# Patient Record
Sex: Female | Born: 1950 | Race: White | Hispanic: No | Marital: Married | Smoking: Never smoker
Health system: Southern US, Community
[De-identification: ages and names within clinical notes are randomized; demographics above are authoritative.]

## PROBLEM LIST (undated history)

## (undated) DIAGNOSIS — I10 Essential (primary) hypertension: Secondary | ICD-10-CM

## (undated) DIAGNOSIS — Z950 Presence of cardiac pacemaker: Secondary | ICD-10-CM

---

## 2004-05-20 DIAGNOSIS — S83511A Sprain of anterior cruciate ligament of right knee, initial encounter: Secondary | ICD-10-CM

## 2004-05-20 HISTORY — DX: Sprain of anterior cruciate ligament of right knee, initial encounter: S83.511A

## 2019-11-01 DIAGNOSIS — I10 Essential (primary) hypertension: Secondary | ICD-10-CM | POA: Insufficient documentation

## 2020-05-20 DIAGNOSIS — Z950 Presence of cardiac pacemaker: Secondary | ICD-10-CM

## 2020-05-20 HISTORY — DX: Presence of cardiac pacemaker: Z95.0

## 2021-05-20 DIAGNOSIS — R001 Bradycardia, unspecified: Secondary | ICD-10-CM

## 2021-05-20 HISTORY — DX: Bradycardia, unspecified: R00.1

## 2021-06-05 ENCOUNTER — Emergency Department (HOSPITAL_COMMUNITY): Payer: Managed Care, Other (non HMO)

## 2021-06-05 ENCOUNTER — Other Ambulatory Visit: Payer: Self-pay

## 2021-06-05 ENCOUNTER — Ambulatory Visit (HOSPITAL_COMMUNITY)
Admission: RE | Admit: 2021-06-05 | Discharge: 2021-06-05 | Disposition: A | Discharge status: Other Acute Inpatient Hospital | Payer: Managed Care, Other (non HMO) | Source: Ambulatory Visit

## 2021-06-05 ENCOUNTER — Encounter (HOSPITAL_COMMUNITY): Payer: Self-pay | Admitting: Emergency Medicine

## 2021-06-05 ENCOUNTER — Emergency Department (HOSPITAL_COMMUNITY)
Admission: EM | Admit: 2021-06-05 | Discharge: 2021-06-05 | Disposition: A | Discharge status: Home / Self Care | Payer: Managed Care, Other (non HMO) | Attending: Emergency Medicine | Admitting: Emergency Medicine

## 2021-06-05 VITALS — BP 156/112 | HR 60 | Temp 98.6°F | Resp 20

## 2021-06-05 DIAGNOSIS — T82198A Other mechanical complication of other cardiac electronic device, initial encounter: Secondary | ICD-10-CM | POA: Insufficient documentation

## 2021-06-05 DIAGNOSIS — Y69 Unspecified misadventure during surgical and medical care: Secondary | ICD-10-CM | POA: Insufficient documentation

## 2021-06-05 DIAGNOSIS — T82111A Breakdown (mechanical) of cardiac pulse generator (battery), initial encounter: Secondary | ICD-10-CM

## 2021-06-05 DIAGNOSIS — T829XXA Unspecified complication of cardiac and vascular prosthetic device, implant and graft, initial encounter: Secondary | ICD-10-CM

## 2021-06-05 DIAGNOSIS — I10 Essential (primary) hypertension: Secondary | ICD-10-CM | POA: Diagnosis not present

## 2021-06-05 HISTORY — DX: Essential (primary) hypertension: I10

## 2021-06-05 HISTORY — DX: Presence of cardiac pacemaker: Z95.0

## 2021-06-05 LAB — BASIC METABOLIC PANEL
Anion gap: 10 (ref 5–15)
BUN: 25 mg/dL — ABNORMAL HIGH (ref 8–23)
CO2: 24 mmol/L (ref 22–32)
Calcium: 9.2 mg/dL (ref 8.9–10.3)
Chloride: 102 mmol/L (ref 98–111)
Creatinine, Ser: 0.89 mg/dL (ref 0.44–1.00)
GFR, Estimated: >60 mL/min (ref >60)
Glucose, Bld: 96 mg/dL (ref 70–99)
Potassium: 3.9 mmol/L (ref 3.5–5.1)
Sodium: 136 mmol/L (ref 135–145)

## 2021-06-05 LAB — CBC
HCT: 35.5 % — ABNORMAL LOW (ref 36.0–46.0)
Hemoglobin: 11.8 g/dL — ABNORMAL LOW (ref 12.0–15.0)
MCH: 30.6 pg (ref 26.0–34.0)
MCHC: 33.2 g/dL (ref 30.0–36.0)
MCV: 92.2 fL (ref 80.0–100.0)
Platelets: 303 10*3/uL (ref 150–400)
RBC: 3.85 MIL/uL — ABNORMAL LOW (ref 3.87–5.11)
RDW: 13 % (ref 11.5–15.5)
WBC: 6.3 10*3/uL (ref 4.0–10.5)
nRBC: 0 % (ref 0.0–0.2)

## 2021-06-05 LAB — TROPONIN I (HIGH SENSITIVITY)
Troponin I (High Sensitivity): 3 ng/L (ref <18)
Troponin I (High Sensitivity): 5 ng/L (ref <18)

## 2021-06-05 NOTE — ED Triage Notes (Signed)
Patient here for evaluation of pacemaker problem. Patient had Medtronic pacemaker implanted for bradycardia while in [place] on March 02, 2021. Then four weeks while visiting [place] and [employer] patient states she walked through several metal detectors and started feeling a random shocking sensation around the location of the pacemaker. Patient also states the pacemaker location has moved since it was first implanted. Patient alert, oriented, and in no apparent distress at this time.

## 2021-06-05 NOTE — ED Triage Notes (Signed)
One week ago pt feels pace maker when it kicks in Per pt.

## 2021-06-05 NOTE — ED Provider Notes (Signed)
[facility] EMERGENCY DEPARTMENT Provider Note   CSN: [id number] Arrival date & time: 06/05/21  1300     History  Chief Complaint  Patient presents with   Pacemaker Problem    Mallory Adams is a 71 y.o. female.  With past medical history of hypertension, bradycardia requiring pacemaker placement who presents to the emergency department with complaints that her pacemaker is firing.  Patient states that she has had a sensation about twice a day that her pacemaker is "zapping her."  She states that she feels that multiple times a day and has soreness over the area where her pacemaker was inserted.  She states that it during any of these episodes she is not having chest pain, shortness of breath, lightheadedness or dizziness.  She has never had syncopal episode.  She states that her pacemaker is not also a defibrillator.   Patient had pacemaker placed in October 2022 in [place].  She states that while she was overseas she was having episodes of syncope as well as feeling very weak.  She was found to be bradycardic requiring pacemaker placement.  It appears that she has a single noted atrial pacemaker without cardioverter defibrillator.  She states that she then returned to the states and in December was at [facility].  She states that she had walked through multiple metal detectors and feels like she was zapped going through these as well.  She is unsure if the metal detectors messed up her pacemaker.  Additionally she feels like the pacemaker has migrated about a centimeter medially from where it was inserted.  She has not been able to follow-up with cardiology since returning and states that there was a 2 to 28-month wait to be seen by cardiologist as a new patient.  HPI     Home Medications Prior to Admission medications   Not on File      Allergies    Ciprofloxacin    Review of Systems   Review of Systems  Constitutional:  Negative for diaphoresis and  fever.  Respiratory:  Negative for shortness of breath.   Cardiovascular:  Positive for chest pain. Negative for palpitations and leg swelling.  Gastrointestinal:  Negative for nausea.  Neurological:  Negative for dizziness, syncope and light-headedness.   Physical Exam Updated Vital Signs BP (!) 147/67    Pulse (!) 59    Temp 98.7 F (37.1 C) (Oral)    Resp 17    SpO2 100%  Physical Exam Vitals and nursing note reviewed.  Constitutional:      General: She is not in acute distress.    Appearance: Normal appearance. She is not ill-appearing or toxic-appearing.  HENT:     Head: Normocephalic and atraumatic.     Mouth/Throat:     Mouth: Mucous membranes are moist.     Pharynx: Oropharynx is clear.  Eyes:     General: No scleral icterus.    Extraocular Movements: Extraocular movements intact.     Pupils: Pupils are equal, round, and reactive to light.  Cardiovascular:     Rate and Rhythm: Normal rate and regular rhythm.     Pulses:          Radial pulses are 2+ on the right side and 2+ on the left side.       Dorsalis pedis pulses are 1+ on the right side and 1+ on the left side.     Heart sounds: No murmur heard. Pulmonary:     Effort:  Pulmonary effort is normal. No respiratory distress.     Breath sounds: Normal breath sounds.  Chest:     Comments: 2 cm well-healing scar in the left chest wall from pacemaker placement.  The pacemaker is palpable under the scarring.  There is no erythema, cellulitis, drainage or warmth over the area. Abdominal:     General: Bowel sounds are normal. There is no distension.     Palpations: Abdomen is soft.  Musculoskeletal:        General: Normal range of motion.     Cervical back: Normal range of motion and neck supple.     Right lower leg: No edema.     Left lower leg: No edema.  Skin:    General: Skin is warm and dry.     Capillary Refill: Capillary refill takes less than 2 seconds.     Findings: No erythema.  Neurological:     General:  No focal deficit present.     Mental Status: She is alert and oriented to person, place, and time. Mental status is at baseline.  Psychiatric:        Mood and Affect: Mood normal.        Behavior: Behavior normal.        Thought Content: Thought content normal.        Judgment: Judgment normal.    ED Results / Procedures / Treatments   Labs (all labs ordered are listed, but only abnormal results are displayed) Labs Reviewed  BASIC METABOLIC PANEL - Abnormal; Notable for the following components:      Result Value   BUN 25 (*)    All other components within normal limits  CBC - Abnormal; Notable for the following components:   RBC 3.85 (*)    Hemoglobin 11.8 (*)    HCT 35.5 (*)    All other components within normal limits  TROPONIN I (HIGH SENSITIVITY)  TROPONIN I (HIGH SENSITIVITY)    EKG EKG Interpretation  Date/Time:  Tuesday June 05 2021 13:07:05 EST Ventricular Rate:  60 PR Interval:  124 QRS Duration: 90 QT Interval:  400 QTC Calculation: 400 R Axis:   80 Text Interpretation: Atrial-paced rhythm No previous tracing Confirmed by Cathren LaineSteinl, Kevin ([id number]) on 06/05/2021 8:40:59 PM  Radiology DG Chest 2 View  Result Date: 06/05/2021 CLINICAL DATA:  Palpitations EXAM: CHEST - 2 VIEW COMPARISON:  None. FINDINGS: Heart size is normal. Mediastinum appears normal. Left-sided cardiac pacemaker device. Lungs are mildly hyperinflated. No focal consolidation, pleural effusion or pneumothorax identified. IMPRESSION: No acute intrathoracic process identified. Electronically Signed   By: Jannifer Hickelaney  Williams M.D.   On: 06/05/2021 14:19    Procedures Procedures   Medications Ordered in ED Medications - No data to display  ED Course/ Medical Decision Making/ A&P                           Medical Decision Making Patient presents to the ED with complaints of her firing. This involves an extensive number of treatment options, and is a complaint that carries with it a high risk of  complications and morbidity.   Additional history obtained:  Additional history obtained from: Husband External records from outside source obtained and reviewed including: Previous primary care visits  EKG: Atrial paced at rate of 60.  No ectopy  Cardiac Monitoring: The patient was maintained on a cardiac monitor.  I personally viewed and interpreted the cardiac monitored which showed an underlying  rhythm of: Paced rhythm  Lab Results: I Ordered, reviewed, and interpreted labs. Pertinent results include: BMP unremarkable CBC with hemoglobin of 11.8, no previous to compare.  She is not tachycardic, hypotensive, pale or symptomatic. Troponin x2 negative  Imaging Studies ordered:  I ordered imaging studies which included chest x-ray.  I independently reviewed & interpreted imaging & am in agreement with radiology impression. Imaging shows: No acute intrathoracic processes identified  Critical Interventions: Pacemaker interrogated by Medtronic  Consultations: 1823: Spoke with Dr. Shari Prows, cardiology, who has reviewed Medtronic interrogation.  She has also spoken with [employer] representative who states that pacemaker programming is in unipolar rather than bipolar mode.  She has dispatched somebody to present to the emergency department and reprogram pacemaker.  Feels she can likely be discharged after this.  ED Course: 71 year old female who presents emergency department for the feeling that her pacemaker is firing.  She is otherwise asymptomatic.  Her lab work is unremarkable here.  EKG without signs of ischemia or infarction and troponin x2 negative.  She does not have chest pain or shortness of breath. Her pacemaker was interrogated by Medtronic.  Dr. Shari Prows with cardiology reviewed the Medtronic interrogation.  She states that there is a [employer] representative who is presenting to the emergency department to evaluate the pacemaker and ensure that it is properly  functioning.  Problem List:  Pacemaker problem Patient hemodynamically stable, nontoxic in appearance otherwise asymptomatic except for feeling like her pacemaker is firing EKG without ischemia or infarction.  Atrially paced.  Troponin x2 negative.  Doubt ACS. Symptoms are consistent with PE, pneumothorax, dissection, pericarditis, tamponade, pneumonia, myocarditis Medtronic interrogated pacemaker At time of handoff to attending, Dr. Denton Lank patient is pending Med[employer] representative presenting to the emergency department to evaluate her pacemaker present.  Per Dr. Julianne Handler and she may need to change from unipolar to bipolar mode however [employer] will evaluate this.  She thinks that patient is likely stable for discharge after it is evaluated.  Dispostion:  After consideration of the diagnostic results and the patients response to treatment, I feel that the patent would benefit from likely discharge.  [employer] is currently presenting to the emergency department to evaluate the patient's pacemaker and discharge will be pending this.  Patient care handed off to Dr. Denton Lank prior to patient final disposition.   Final Clinical Impression(s) / ED Diagnoses Final diagnoses:  Disorder of cardiac pacemaker system, initial encounter    Rx / DC Orders ED Discharge Orders          Ordered    Ambulatory referral to Cardiology       Comments: Recent pacemaker insertion in [place] for bradycardia, no cardiologist in area for follow-up. Seen in ED for abnormal firing. Found to be programmed in unipolar, not bipolar. Fixed by Medtronic in ED.   06/05/21 1907              Cristopher Peru, PA-C 06/07/21 2229    Bethann Berkshire, MD 06/07/21 1148

## 2021-06-05 NOTE — ED Notes (Signed)
Pt provided with warm blankets. Lights dimmed and head of bed lowered per pt request. Denies further needs. Call light within reach.

## 2021-06-05 NOTE — ED Notes (Signed)
TC to triage in ED to give report on Pt with pace maker problem.

## 2021-06-05 NOTE — ED Provider Triage Note (Signed)
Emergency Medicine Provider Triage Evaluation Note  Mallory Adams , a 71 y.o. female  was evaluated in triage.  Pt complains of a pacemaker problem.  Patient states that she had a Medtronic pacemaker implanted for bradycardia while in [place] on 03/02/21.  She states 4 weeks later she was visiting [address], and states that while she was walking through [employer] she began feeling random shocking sensation around the location of her pacemaker.  She has continued to feel these symptoms for the past week since she is gotten back.  She also states that she thinks the pacemaker location has moved since it was first implanted.  Review of Systems  Positive: Palpitations, shortness of breath Negative: Fever, chills, cough, chest pain  Physical Exam  BP (!) 148/72 (BP Location: Right Arm)    Pulse (!) 59    Temp 98.7 F (37.1 C) (Oral)    Resp 14    SpO2 100%  Gen:   Awake, no distress   Resp:  Normal effort  MSK:   Moves extremities without difficulty  Other:    Medical Decision Making  Medically screening exam initiated at 1:22 PM.  Appropriate orders placed.  Mallory Adams was informed that the remainder of the evaluation will be completed by another provider, this initial triage assessment does not replace that evaluation, and the importance of remaining in the ED until their evaluation is complete.     Mallory Encarnacion T, PA-C 06/05/21 1327

## 2021-06-05 NOTE — Discharge Instructions (Addendum)
It was our pleasure to provide your ER care today - we hope that you feel better.  The medtronic/pacemaker tech indicates it currently appears your device is working normally.   Follow up closely with cardiologist in the next 1-2 weeks - call office tomorrow AM to arrange appointment.   Return to ER if worse, new symptoms, fevers, persistent fast heart beat, fainting, chest pain, trouble breathing, or other concern.

## 2021-06-05 NOTE — Progress Notes (Signed)
Contacted by medtronic rep, pacemaker with normal function. Could not reproduce her symptoms with high output pacing and symptoms did not resolve with inhibition of pacing. Symptoms are not related to pacemaker, ok for discharge from cardiac standpoint.   Carlyle Dolly MD

## 2021-06-05 NOTE — ED Provider Notes (Signed)
Medtronic tech evaluated pacemaker and indicates it is functioning normally, and device function/activity does not appear to be related to patients symptomatology. He discussed w cardiology.  Pt alert, content, no distres. Vitals stable.  Patient currently appears stable for d/c.   Rec outpatient f/u pcp/cardiology.  Return precautions provided.      Lajean Saver, MD 06/05/21 2030

## 2021-06-05 NOTE — ED Provider Notes (Signed)
[facility]    CSN: [id number] Arrival date & time: 06/05/21  1203      History   Chief Complaint Chief Complaint  Patient presents with   pace maker problem    HPI Mallory Adams is a 71 y.o. female.   Patient presents for evaluation of pacemaker which has been misfiring since mid December after visit to [place].  Patient endorses that unknown to her knowledge there were metal detectors placed in the lines of the rides.  Symptoms began after visit.  Can feel pacemaker shocking her and her skin at incision site turns red.  Pacemaker was placed in [place] in mid October 2022 due to bradycardia and syncope which patient endorses runs in her family.  Denies chest pain or tightness, shortness of breath, palpitations, dizziness, lightheadedness.  No past medical history on file.  There are no problems to display for this patient.    OB History   No obstetric history on file.      Home Medications    Prior to Admission medications   Not on File    Family History No family history on file.  Social History     Allergies   Patient has no allergy information on record.   Review of Systems Review of Systems   Physical Exam Triage Vital Signs ED Triage Vitals  Enc Vitals Group     BP 06/05/21 1232 (!) 156/112     Pulse Rate 06/05/21 1232 60     Resp 06/05/21 1232 20     Temp 06/05/21 1232 98.6 F (37 C)     Temp src --      SpO2 --      Weight --      Height --      Head Circumference --      Peak Flow --      Pain Score 06/05/21 1233 0     Pain Loc --      Pain Edu? --      Excl. in GC? --    No data found.  Updated Vital Signs BP (!) 156/112    Pulse 60    Temp 98.6 F (37 C)    Resp 20   Visual Acuity Right Eye Distance:   Left Eye Distance:   Bilateral Distance:    Right Eye Near:   Left Eye Near:    Bilateral Near:     Physical Exam   UC Treatments / Results  Labs (all labs ordered are listed, but only  abnormal results are displayed) Labs Reviewed - No data to display  EKG   Radiology No results found.  Procedures Procedures (including critical care time)  Medications Ordered in UC Medications - No data to display  Initial Impression / Assessment and Plan / UC Course  I have reviewed the triage vital signs and the nursing notes.  Pertinent labs & imaging results that were available during my care of the patient were reviewed by me and considered in my medical decision making (see chart for details).  Malfunction of cardiac pacemaker, initial encounter  Patient sent to the nearest emergency department for evaluation of missed firing of pacemaker.  Vital signs are stable, will be escorted by family member. Final Clinical Impressions(s) / UC Diagnoses   Final diagnoses:  None   Discharge Instructions   None    ED Prescriptions   None    PDMP not reviewed this encounter.   Valinda Hoar, NP 06/05/21  1301 ° °

## 2021-06-05 NOTE — ED Notes (Signed)
Pacemaker interrogated at this time.

## 2021-06-25 ENCOUNTER — Other Ambulatory Visit: Payer: Self-pay

## 2021-06-25 ENCOUNTER — Ambulatory Visit (INDEPENDENT_AMBULATORY_CARE_PROVIDER_SITE_OTHER): Payer: Self-pay | Admitting: Family

## 2021-06-25 ENCOUNTER — Encounter: Payer: Self-pay | Admitting: Family

## 2021-06-25 VITALS — BP 144/73 | HR 60 | Temp 97.3°F | Ht 64.5 in | Wt 141.8 lb

## 2021-06-25 DIAGNOSIS — Z950 Presence of cardiac pacemaker: Secondary | ICD-10-CM

## 2021-06-25 DIAGNOSIS — Z1231 Encounter for screening mammogram for malignant neoplasm of breast: Secondary | ICD-10-CM

## 2021-06-25 DIAGNOSIS — M816 Localized osteoporosis [Lequesne]: Secondary | ICD-10-CM

## 2021-06-25 DIAGNOSIS — I10 Essential (primary) hypertension: Secondary | ICD-10-CM

## 2021-06-25 NOTE — Patient Instructions (Signed)
Welcome to [facility]! It was a pleasure meeting you today.  As discussed, I have ordered a bone density scan and mammogram today, the [facility] will call you directly to schedule. Please let Cardiology know that I am your PCP now so that I will receive a copy of their notes.  Please schedule a 6 month follow up visit today if you are needing refills.    PLEASE NOTE:  If you had any LAB tests please let us know if you have not heard back within a few days. You may see your results on MyChart before we have a chance to review them but we will give you a call once they are reviewed by [place]. If we ordered any REFERRALS today, please let us know if you have not heard from their office within the next week.  Let us know through MyChart if you are needing REFILLS, or have your pharmacy send [place] the request. You can also use MyChart to communicate with me or any office staff.  Please try these tips to maintain a healthy lifestyle:  Eat most of your calories during the day when you are active. Eliminate processed foods including packaged sweets (pies, cakes, cookies), reduce intake of potatoes, white bread, white pasta, and white rice. Look for whole grain options, oat flour or almond flour.  Each meal should contain half fruits/vegetables, one quarter protein, and one quarter carbs (no bigger than a computer mouse).  Cut down on sweet beverages. This includes juice, soda, and sweet tea. Also watch fruit intake, though this is a healthier sweet option, it still contains natural sugar! Limit to 3 servings daily.  Drink at least 1 glass of water with each meal and aim for at least 8 glasses per day  Exercise at least 150 minutes every week.

## 2021-06-25 NOTE — Progress Notes (Signed)
New Patient Office Visit  Subjective:  Patient ID: Mallory Adams, female    DOB: 05-Aug-1950  Age: 71 y.o. MRN: [id number]  CC:  Chief Complaint  Patient presents with   Establish Care   Pacemaker Problem    Shocks; Pt feels like these are Post Covid symtpoms.   Hypertension    HPI Mallory Adams presents for establishing care and to discuss 2 problems.  Hypertension: Patient is currently maintained on the following medications for blood pressure: Amlodipine. Failed meds include: None Patient reports good compliance with blood pressure medications. Patient denies chest pain, headaches, shortness of breath or swelling. Last 3 blood pressure readings in our office are as follows: BP Readings from Last 3 Encounters:  06/25/21 (!) 144/73  06/05/21 (!) 161/64  06/05/21 (!) 156/112   Pacemaker:  pt reports sx in 02/2021 while living in [place] with her husband with having syncopal episode, feeling tired, evaluated and found to have bradycardia, Pacemaker then inserted. Reports she still felt tired afterwards, and she was pacing at 50, increased to 60 and she felt better. Since visiting [place] in Dec she has been feeling "zaps" from her pacemaker that cause soreness and her insertion scar to redden. She was evaluated a few weeks ago in the ER and pacemaker found to be operating properly, but may need adjusting, pt states she has a cardiology appt scheduled.   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 2022    History reviewed. No pertinent surgical history.  Family History  Problem Relation Age of Onset   Depression Mother    Mental illness Mother    Heart disease Father    Heart attack Father    Stroke Sister    Early death Brother    Drug abuse  Brother    Alcohol abuse Brother     Social History   Socioeconomic History   Marital status: Married    Spouse name: Dub Mikes   Number of children: Not on file   Years of education: Not on file   Highest education level: Not on file  Occupational History   Not on file  Tobacco Use   Smoking status: Never   Smokeless tobacco: Never  Substance and Sexual Activity   Alcohol use: Yes    Alcohol/week: 2.0 standard drinks    Types: 2 Glasses of wine per week   Drug use: Never   Sexual activity: Yes    Partners: Male  Other Topics Concern   Not on file  Social History Narrative   pt is a retired Electrical engineer   Social Determinants of Radio broadcast assistant Strain: Not on file  Food Insecurity: Not on file  Transportation Needs: Not on file  Physical Activity: Not on file  Stress: Not on file  Social Connections: Not on file  Intimate Partner Violence: Not on file    Objective:   Today's Vitals: BP (!) 144/73    Pulse 60    Temp (!) 97.3 F (36.3 C) (Temporal)    Ht 5' 4.5" (1.638 m)    Wt 141 lb 12.8 oz (64.3 kg)    SpO2 98%    BMI 23.96 kg/m   Physical Exam Vitals and nursing note reviewed.  Constitutional:      Appearance: Normal appearance.  Cardiovascular:     Rate and Rhythm: Normal rate and regular rhythm.  Pulmonary:     Effort: Pulmonary effort is normal.     Breath sounds: Normal breath sounds.  Musculoskeletal:        General: Normal range of motion.  Skin:    General: Skin is warm and dry.     Comments: subdermal pacemaker noted and scar with mild erythema on left upper chest  Neurological:     Mental Status: She is alert.  Psychiatric:        Mood and Affect: Mood normal.        Behavior: Behavior normal.    Assessment & Plan:   Problem List Items Addressed This Visit       Cardiovascular and Mediastinum   Essential hypertension    Chronic - elevated today with Amlodipine, pt would like to wait until she sees Cardiology  (pacemaker problems) before getting a refill, has enough pills for now. Advised to f/u in 6 mos if refill needed.      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 denies any hx of smoking or fracture.      Relevant Medications   Cholecalciferol 25 MCG (1000 UT) capsule   Other Relevant Orders   DG Bone Density     Other   Cardiac pacemaker in situ - Primary    placed 02/2021 in [place]. set at 60bpm, but reports her skin is more flushed in color and feeling "zaps" several times per day, evaluated in ER a few weeks, all testing wnl, may need a bipolar vs. unipolar pacer,  pt has already scheduled a Cardiology appt.      Other Visit Diagnoses     Encounter for screening mammogram for breast cancer       Relevant Orders   MM DIGITAL SCREENING BILATERAL       Outpatient Encounter Medications as of 06/25/2021  Medication Sig   amLODipine (NORVASC) 5 MG tablet Take by mouth.   BLACK PEPPER-TURMERIC PO Take by mouth.   Cholecalciferol 25 MCG (1000 UT) capsule Take 1 capsule by mouth daily.   Magnesium 250 MG TABS Take 1 tablet by mouth daily.   Multiple Vitamins-Minerals (MULTIVITAMIN WOMEN 50+) TABS Take 1 tablet by mouth daily.   Omega-3 Fatty Acids (FISH OIL) 1000 MG CAPS Take by mouth.   No facility-administered encounter medications on file as of 06/25/2021.    Follow-up: Return in about 6 months (around 12/23/2021) for if medication refill needed or sooner for any concerns.   Jeanie Sewer, NP

## 2021-06-25 NOTE — Assessment & Plan Note (Addendum)
placed 02/2021 in [place]. set at 60bpm, but reports her skin is more flushed in color and feeling "zaps" several times per day, evaluated in ER a few weeks, all testing wnl, may need a bipolar vs. unipolar pacer,  pt has already scheduled a Cardiology appt.

## 2021-06-25 NOTE — Assessment & Plan Note (Addendum)
Chronic - elevated today with Amlodipine, pt would like to wait until she sees Cardiology (pacemaker problems) before getting a refill, has enough pills for now. Advised to f/u in 6 mos if refill needed.

## 2021-06-25 NOTE — Assessment & Plan Note (Addendum)
pt told this is in her right hip, DEXA scan done in [place], do not have records, repeating scan. denies any hx of smoking or fracture.

## 2021-07-02 ENCOUNTER — Other Ambulatory Visit: Payer: Self-pay | Admitting: Family

## 2021-07-02 ENCOUNTER — Other Ambulatory Visit: Payer: Self-pay

## 2021-07-02 MED ORDER — AMLODIPINE BESYLATE 5 MG PO TABS
5.0000 mg | ORAL_TABLET | Freq: Every day | ORAL | 0 refills | Status: DC
  Filled 2021-07-02 – 2021-07-04 (×2): qty 30, 30d supply, fill #0

## 2021-07-02 NOTE — Progress Notes (Deleted)
Electrophysiology Office Note:    Date:  07/02/2021   ID:  Mallory Adams, DOB [birth year], MRN [id number]  PCP:  Dulce Sellar, NP  [facility] Cardiologist:  None  [facility] Electrophysiologist:  None   Referring MD: Cristopher Peru, PA-C   Chief Complaint: Permanent pacemaker  History of Present Illness:    Mallory Adams is a 71 y.o. female who presents for an evaluation of permanent pacemaker at the request of [name], PA-C. Their medical history includes symptomatic bradycardia post permanent pacemaker implant in [place].  She presents to establish care in our clinic.  The pacemaker was implanted in October 2022 in [place].  While traveling there she was having episodes of syncope.  She recently presented to the emergency department on June 05, 2021 reporting a "zapping" sensation from her pacemaker.  The device was interrogated and found to be functioning appropriately.  According to notes, the device was found to be in a unipolar pacing configuration by the [employer] representative.  The device was reprogrammed to bipolar in an effort to minimize her probable pectoral stim.     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 2022    No past surgical history on file.  Current Medications: No outpatient medications have been marked as taking for the 07/03/21 encounter (Appointment) with Lanier Prude, MD.     Allergies:   Ciprofloxacin, Gluten meal, and Influenza virus vaccine   Social History   Socioeconomic History   Marital status: Married    Spouse name: [name]   Number of children: Not on file   Years of education: Not on file   Highest education level: Not on file  Occupational History   Not on file  Tobacco Use    Smoking status: Never   Smokeless tobacco: Never  Substance and Sexual Activity   Alcohol use: Yes    Alcohol/week: 2.0 standard drinks    Types: 2 Glasses of wine per week   Drug use: Never   Sexual activity: Yes    Partners: Male  Other Topics Concern   Not on file  Social History Narrative   pt is a retired Doctor, general practice   Social Determinants of Corporate investment banker Strain: Not on file  Food Insecurity: Not on file  Transportation Needs: Not on file  Physical Activity: Not on file  Stress: Not on file  Social Connections: Not on file     Family History: The patient's family history includes Alcohol abuse in her brother; Depression in her mother; Drug abuse in her brother; Early death in her brother; Heart attack in her father; Heart disease in her father; Mental illness in her mother; Stroke in her sister.  ROS:   Please see the history of present illness.    All other systems reviewed and are negative.  EKGs/Labs/Other Studies Reviewed:    The following studies were reviewed today:  June 06, 2021 EKG shows atrial pacing, ventricular sensing  Chest x-ray from June 05, 2021 shows a dual-chamber permanent pacemaker in situ originating from the left chest.  EKG:  The ekg ordered today demonstrates ***   Recent Labs: 06/05/2021: BUN 25; Creatinine, Ser 0.89; Hemoglobin 11.8; Platelets 303; Potassium 3.9; Sodium 136  Recent  Lipid Panel No results found for: CHOL, TRIG, HDL, CHOLHDL, VLDL, LDLCALC, LDLDIRECT  Physical Exam:    VS:  There were no vitals taken for this visit.    Wt Readings from Last 3 Encounters:  06/25/21 141 lb 12.8 oz (64.3 kg)     GEN: *** Well nourished, well developed in no acute distress HEENT: Normal NECK: No JVD; No carotid bruits LYMPHATICS: No lymphadenopathy CARDIAC: ***RRR, no murmurs, rubs, gallops RESPIRATORY:  Clear to auscultation without rales, wheezing or rhonchi  ABDOMEN: Soft, non-tender,  non-distended MUSCULOSKELETAL:  No edema; No deformity  SKIN: Warm and dry NEUROLOGIC:  Alert and oriented x 3 PSYCHIATRIC:  Normal affect       ASSESSMENT:    1. Cardiac pacemaker in situ   2. Symptomatic bradycardia    PLAN:    In order of problems listed above:  Establish in pacemaker clinic       Total time spent with patient today *** minutes. This includes reviewing records, evaluating the patient and coordinating care.  Medication Adjustments/Labs and Tests Ordered: Current medicines are reviewed at length with the patient today.  Concerns regarding medicines are outlined above.  No orders of the defined types were placed in this encounter.  No orders of the defined types were placed in this encounter.    Signed, Rossie Muskrat. Lalla Brothers, MD, [facility], [facility] 07/02/2021 12:34 PM    Electrophysiology [facility]

## 2021-07-03 ENCOUNTER — Other Ambulatory Visit: Payer: Self-pay

## 2021-07-03 ENCOUNTER — Ambulatory Visit (INDEPENDENT_AMBULATORY_CARE_PROVIDER_SITE_OTHER): Payer: Self-pay | Admitting: Cardiology

## 2021-07-03 ENCOUNTER — Encounter: Payer: Self-pay | Admitting: Cardiology

## 2021-07-03 VITALS — BP 120/68 | HR 60 | Ht 64.5 in | Wt 142.2 lb

## 2021-07-03 DIAGNOSIS — R001 Bradycardia, unspecified: Secondary | ICD-10-CM

## 2021-07-03 DIAGNOSIS — Z950 Presence of cardiac pacemaker: Secondary | ICD-10-CM

## 2021-07-03 NOTE — Progress Notes (Addendum)
Electrophysiology Office Note:    Date:  07/03/2021   ID:  Mallory Adams, DOB [birth year], MRN [id number]  PCP:  Dulce Sellar, NP  [facility] Cardiologist:  None  [facility] Electrophysiologist:  None   Referring MD: Cristopher Peru, PA-C   Chief Complaint: Permanent pacemaker  History of Present Illness:    Mallory Adams is a 71 y.o. female who presents for an evaluation of permanent pacemaker at the request of Mallory Baars, PA-C. Their medical history includes symptomatic bradycardia post permanent pacemaker implant in [place].  She presents to establish care in our clinic.  The pacemaker was implanted in October 2022 in [place].  While traveling there she was having episodes of syncope.  She recently presented to the emergency department on June 05, 2021 reporting a "zapping" sensation from her pacemaker.  The device was interrogated and found to be functioning appropriately.  According to notes, the device was found to be in a unipolar pacing configuration by the [employer] representative.  The device was reprogrammed to bipolar in an effort to minimize her probable pectoral stim.  Overall, she appears well. Initially she believed she was having anxiety attacks while she was in [place]. She describes feeling like she has having "a sudden, little dream" prior to becoming syncopal.  Since her device was reprogrammed to bipolar, she notes her incision is still red, and it is perpetually painful. This concerns her as she believed her initial incision seemed to heal more quickly.  She denies any palpitations, chest pain, shortness of breath, or peripheral edema. No lightheadedness, headaches, orthopnea, or PND.      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 2022    History reviewed. No pertinent surgical history.  Current Medications: Current Meds  Medication Sig   amLODipine (NORVASC) 5 MG tablet Take 1 tablet (5 mg total) by mouth daily.   BLACK PEPPER-TURMERIC PO Take by mouth.   Cholecalciferol 25 MCG (1000 UT) capsule Take 1 capsule by mouth daily.   Magnesium 250 MG TABS Take 1 tablet by mouth daily.   Multiple Vitamins-Minerals (MULTIVITAMIN WOMEN 50+) TABS Take 1 tablet by mouth daily.   Omega-3 Fatty Acids (FISH OIL) 1000 MG CAPS Take by mouth.     Allergies:   Ciprofloxacin, Gluten meal, and Influenza virus vaccine   Social History   Socioeconomic History   Marital status: Married    Spouse name: Mallory Adams   Number of children: Not on file   Years of education: Not on file   Highest education level: Not on file  Occupational History   Not on file  Tobacco Use   Smoking status: Never   Smokeless tobacco: Never  Vaping Use   Vaping Use: Not on file  Substance and Sexual Activity   Alcohol use: Yes    Alcohol/week: 2.0 standard drinks    Types: 2 Glasses of wine per week   Drug use: Never   Sexual activity: Yes    Partners: Male  Other Topics Concern   Not on file  Social History Narrative   pt is a retired Doctor, general practice   Social Determinants of Corporate investment banker Strain: Not on file  Food Insecurity: Not on file  Transportation Needs:  Not on file  Physical Activity: Not on file  Stress: Not on file  Social Connections: Not on file     Family History: The patient's family history includes Alcohol abuse in her brother; Depression in her mother; Drug abuse in her brother; Early death in her brother; Heart attack in her father; Heart disease in her father; Mental illness in her mother; Stroke in her sister.  ROS:   Please see the history of present illness.    (+) Left chest pain at site of incision All other systems reviewed and are negative.  EKGs/Labs/Other Studies Reviewed:     The following studies were reviewed today:  June 06, 2021 EKG shows atrial pacing, ventricular sensing  Chest x-ray from June 05, 2021 shows a dual-chamber permanent pacemaker in situ originating from the left chest.  July 03, 2021 in clinic device interrogation personally reviewed Lead parameters stable.  Adjusted lead outputs to maximize battery longevity.  EKG:  EKG is personally reviewed. 07/03/2021: Atrial pacing, ventricular sensing   Recent Labs: 06/05/2021: BUN 25; Creatinine, Ser 0.89; Hemoglobin 11.8; Platelets 303; Potassium 3.9; Sodium 136   Recent Lipid Panel No results found for: CHOL, TRIG, HDL, CHOLHDL, VLDL, LDLCALC, LDLDIRECT  Physical Exam:    VS:  BP 120/68    Pulse 60    Ht 5' 4.5" (1.638 m)    Wt 142 lb 3.2 oz (64.5 kg)    SpO2 98%    BMI 24.03 kg/m     Wt Readings from Last 3 Encounters:  07/03/21 142 lb 3.2 oz (64.5 kg)  06/25/21 141 lb 12.8 oz (64.3 kg)     GEN: Well nourished, well developed in no acute distress HEENT: Normal NECK: No JVD; No carotid bruits LYMPHATICS: No lymphadenopathy CARDIAC: RRR, no murmurs, rubs, gallops.  Well-healed pacemaker scar.  No fluctuance in the pocket. RESPIRATORY:  Clear to auscultation without rales, wheezing or rhonchi  ABDOMEN: Soft, non-tender, non-distended MUSCULOSKELETAL:  No edema; No deformity  SKIN: Warm and dry NEUROLOGIC:  Alert and oriented x 3 PSYCHIATRIC:  Normal affect       ASSESSMENT:    1. Cardiac pacemaker in situ   2. Symptomatic bradycardia    PLAN:    In order of problems listed above:   #Symptomatic bradycardia #Pacemaker in situ Device functioning appropriately.  Adjusted lead parameters to maximize battery longevity.  We will establish her in our device clinic for routine monitoring.  Follow-up in our clinic in 1 year or sooner as needed.    Medication Adjustments/Labs and Tests Ordered: Current medicines are reviewed at length with the patient today.   Concerns regarding medicines are outlined above.   Orders Placed This Encounter  Procedures   EKG 12-Lead   No orders of the defined types were placed in this encounter.  I,Mathew Stumpf,acting as a Neurosurgeon for Lanier Prude, MD.,have documented all relevant documentation on the behalf of Lanier Prude, MD,as directed by  Lanier Prude, MD while in the presence of Lanier Prude, MD.  I, Lanier Prude, MD, have reviewed all documentation for this visit. The documentation on 07/03/21 for the exam, diagnosis, procedures, and orders are all accurate and complete.   Signed, Rossie Muskrat. Lalla Brothers, MD, [facility], [facility] 07/03/2021 10:56 AM    Electrophysiology [facility]

## 2021-07-03 NOTE — Patient Instructions (Addendum)
Medication Instructions:  Your physician recommends that you continue on your current medications as directed. Please refer to the Current Medication list given to you today. *If you need a refill on your cardiac medications before your next appointment, please call your pharmacy*  Lab Work: None. If you have labs (blood work) drawn today and your tests are completely normal, you will receive your results only by: MyChart Message (if you have MyChart) OR A paper copy in the mail If you have any lab test that is abnormal or we need to change your treatment, we will call you to review the results.  Testing/Procedures: None.  Follow-Up: At [facility], you and your health needs are our priority.  As part of our continuing mission to provide you with exceptional heart care, we have created designated Provider Care Teams.  These Care Teams include your primary Cardiologist (physician) and Advanced Practice Providers (APPs -  Physician Assistants and Nurse Practitioners) who all work together to provide you with the care you need, when you need it.  Your physician wants you to follow-up in: 12 months with Cameron Lambert, MD     You will receive a reminder letter in the mail two months in advance. If you don't receive a letter, please call our office to schedule the follow-up appointment.  We recommend signing up for the patient portal called "MyChart".  Sign up information is provided on this After Visit Summary.  MyChart is used to connect with patients for Virtual Visits (Telemedicine).  Patients are able to view lab/test results, encounter notes, upcoming appointments, etc.  Non-urgent messages can be sent to your provider as well.   To learn more about what you can do with MyChart, go to [email or web address].    Any Other Special Instructions Will Be Listed Below (If Applicable).         

## 2021-07-04 ENCOUNTER — Other Ambulatory Visit: Payer: Self-pay

## 2021-07-05 ENCOUNTER — Other Ambulatory Visit: Payer: Self-pay

## 2021-07-09 ENCOUNTER — Other Ambulatory Visit: Payer: Self-pay

## 2021-07-10 ENCOUNTER — Other Ambulatory Visit: Payer: Self-pay

## 2021-07-10 ENCOUNTER — Ambulatory Visit (HOSPITAL_BASED_OUTPATIENT_CLINIC_OR_DEPARTMENT_OTHER)
Admission: RE | Admit: 2021-07-10 | Discharge: 2021-07-10 | Disposition: A | Discharge status: Home / Self Care | Payer: BLUE CROSS/BLUE SHIELD | Source: Ambulatory Visit | Attending: Family | Admitting: Family

## 2021-07-10 ENCOUNTER — Encounter (HOSPITAL_BASED_OUTPATIENT_CLINIC_OR_DEPARTMENT_OTHER): Payer: Self-pay

## 2021-07-10 DIAGNOSIS — Z1231 Encounter for screening mammogram for malignant neoplasm of breast: Secondary | ICD-10-CM | POA: Diagnosis present

## 2021-07-12 ENCOUNTER — Ambulatory Visit (HOSPITAL_BASED_OUTPATIENT_CLINIC_OR_DEPARTMENT_OTHER)
Admission: RE | Admit: 2021-07-12 | Discharge: 2021-07-12 | Disposition: A | Discharge status: Home / Self Care | Payer: BLUE CROSS/BLUE SHIELD | Source: Ambulatory Visit | Attending: Family | Admitting: Family

## 2021-07-12 ENCOUNTER — Other Ambulatory Visit: Payer: Self-pay

## 2021-07-12 DIAGNOSIS — M816 Localized osteoporosis [Lequesne]: Secondary | ICD-10-CM | POA: Insufficient documentation

## 2021-07-17 ENCOUNTER — Encounter: Payer: Self-pay | Admitting: Family

## 2021-07-30 ENCOUNTER — Other Ambulatory Visit: Payer: Self-pay | Admitting: Cardiology

## 2021-09-27 ENCOUNTER — Encounter: Payer: Self-pay | Admitting: Family

## 2021-09-28 ENCOUNTER — Other Ambulatory Visit: Payer: Self-pay

## 2021-09-28 DIAGNOSIS — I10 Essential (primary) hypertension: Secondary | ICD-10-CM

## 2021-09-28 MED ORDER — AMLODIPINE BESYLATE 5 MG PO TABS: 5.0000 mg | ORAL_TABLET | Freq: Every day | ORAL | 0 refills | Status: DC

## 2021-10-01 ENCOUNTER — Telehealth: Payer: Self-pay

## 2021-10-01 NOTE — Telephone Encounter (Signed)
I ordered the patient a new monitor. She should receive it in 7-10 business days. I told her once she get the monitor to send a transmission and we will update her remote schedule. ?

## 2021-10-05 ENCOUNTER — Ambulatory Visit (HOSPITAL_COMMUNITY)
Admission: EM | Admit: 2021-10-05 | Discharge: 2021-10-05 | Disposition: A | Discharge status: Home / Self Care | Payer: BC Managed Care – PPO | Attending: Physician Assistant | Admitting: Physician Assistant

## 2021-10-05 ENCOUNTER — Encounter (HOSPITAL_COMMUNITY): Payer: Self-pay

## 2021-10-05 DIAGNOSIS — R300 Dysuria: Secondary | ICD-10-CM | POA: Diagnosis present

## 2021-10-05 DIAGNOSIS — R55 Syncope and collapse: Secondary | ICD-10-CM | POA: Diagnosis not present

## 2021-10-05 DIAGNOSIS — R42 Dizziness and giddiness: Secondary | ICD-10-CM | POA: Diagnosis present

## 2021-10-05 DIAGNOSIS — N390 Urinary tract infection, site not specified: Secondary | ICD-10-CM | POA: Diagnosis not present

## 2021-10-05 LAB — POCT URINALYSIS DIPSTICK, ED / UC
Bilirubin Urine: NEGATIVE
Glucose, UA: NEGATIVE mg/dL
Hgb urine dipstick: NEGATIVE
Ketones, ur: 40 mg/dL — AB
Nitrite: NEGATIVE
Protein, ur: NEGATIVE mg/dL
Specific Gravity, Urine: 1.01 (ref 1.005–1.030)
Urobilinogen, UA: 0.2 mg/dL (ref 0.0–1.0)
pH: 6 (ref 5.0–8.0)

## 2021-10-05 LAB — CBC WITH DIFFERENTIAL/PLATELET
Abs Immature Granulocytes: 0.01 10*3/uL (ref 0.00–0.07)
Basophils Absolute: 0 10*3/uL (ref 0.0–0.1)
Basophils Relative: 1 %
Eosinophils Absolute: 0 10*3/uL (ref 0.0–0.5)
Eosinophils Relative: 0 %
HCT: 37.8 % (ref 36.0–46.0)
Hemoglobin: 12.6 g/dL (ref 12.0–15.0)
Immature Granulocytes: 0 %
Lymphocytes Relative: 19 %
Lymphs Abs: 1.5 10*3/uL (ref 0.7–4.0)
MCH: 30.5 pg (ref 26.0–34.0)
MCHC: 33.3 g/dL (ref 30.0–36.0)
MCV: 91.5 fL (ref 80.0–100.0)
Monocytes Absolute: 0.5 10*3/uL (ref 0.1–1.0)
Monocytes Relative: 6 %
Neutro Abs: 5.9 10*3/uL (ref 1.7–7.7)
Neutrophils Relative %: 74 %
Platelets: 325 10*3/uL (ref 150–400)
RBC: 4.13 MIL/uL (ref 3.87–5.11)
RDW: 12.9 % (ref 11.5–15.5)
WBC: 7.9 10*3/uL (ref 4.0–10.5)
nRBC: 0 % (ref 0.0–0.2)

## 2021-10-05 LAB — COMPREHENSIVE METABOLIC PANEL
ALT: 24 U/L (ref 0–44)
AST: 33 U/L (ref 15–41)
Albumin: 4.4 g/dL (ref 3.5–5.0)
Alkaline Phosphatase: 44 U/L (ref 38–126)
Anion gap: 9 (ref 5–15)
BUN: 17 mg/dL (ref 8–23)
CO2: 25 mmol/L (ref 22–32)
Calcium: 9.8 mg/dL (ref 8.9–10.3)
Chloride: 101 mmol/L (ref 98–111)
Creatinine, Ser: 0.79 mg/dL (ref 0.44–1.00)
GFR, Estimated: >60 mL/min (ref >60)
Glucose, Bld: 105 mg/dL — ABNORMAL HIGH (ref 70–99)
Potassium: 4 mmol/L (ref 3.5–5.1)
Sodium: 135 mmol/L (ref 135–145)
Total Bilirubin: 0.7 mg/dL (ref 0.3–1.2)
Total Protein: 7.4 g/dL (ref 6.5–8.1)

## 2021-10-05 LAB — CBG MONITORING, ED: Glucose-Capillary: 111 mg/dL — ABNORMAL HIGH (ref 70–99)

## 2021-10-05 MED ORDER — CEPHALEXIN 500 MG PO CAPS: 500.0000 mg | ORAL_CAPSULE | Freq: Four times a day (QID) | ORAL | 0 refills | Status: DC

## 2021-10-05 NOTE — Discharge Instructions (Addendum)
Your EKG looked normal.  Your glucose is normal.  I will contact you for lab work is abnormal.  We are going to treat you for urinary tract infection in case this is contributing to your symptoms.  Start Keflex as prescribed.  Make sure you rest and drink plenty of fluid.  Eat small frequent meals.  Follow-up with your primary care first thing next week.  If anything worsens and you have chest pain, shortness of breath, passout, have recurrent episodes of feeling like you get a pass out, nausea/vomiting need to go to the emergency room as we discussed.

## 2021-10-05 NOTE — ED Triage Notes (Signed)
2-3 day h/o congestion, sore throat, clogged bilateral ears, nausea and HA. Has been taking nyquil. Denies cough.

## 2021-10-05 NOTE — ED Provider Notes (Signed)
[facility]    CSN: [id number] Arrival date & time: 10/05/21  1620      History   Chief Complaint Chief Complaint  Patient presents with   Otalgia    HPI Mallory Adams is a 71 y.o. female.   Patient presents today with a several day history of left otalgia and sore throat.  She does report some headache, nausea, as well as lightheadedness/near syncope.  Denies any syncopal episodes.  She denies any known sick contacts or cough, shortness of breath, chest pain, vomiting.  Denies any recent medication changes.  She does report that she has a history of sciatica and was experiencing a sciatica flare when lightheadedness/near syncope episodes began but have persisted despite improvement of the symptoms.  She reports last episode of lightheadedness was this morning.  She denies any medication changes or recent head injury.  She denies any dysarthria, visual disturbance, focal weakness, facial asymmetry.  She does have a history of bradycardia and has pacemaker in place.  She is followed closely by cardiology and denies any ongoing symptoms.   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 2022    Patient Active Problem List   Diagnosis Date Noted   Cardiac pacemaker in situ 06/25/2021   Localized osteoporosis without current pathological fracture 06/25/2021   Essential hypertension 11/01/2019    History reviewed. No pertinent surgical history.  OB History   No obstetric history on file.      Home Medications    Prior to Admission medications   Medication Sig Start Date End Date Taking? Authorizing Provider  cephALEXin (KEFLEX) 500 MG capsule Take 1 capsule (500 mg total) by mouth 4 (four) times daily. 10/05/21  Yes Garry Bochicchio K, PA-C  amLODipine  (NORVASC) 5 MG tablet Take 1 tablet (5 mg total) by mouth daily. 09/28/21 12/27/21  Dulce SellarHudnell, Stephanie, NP  BLACK PEPPER-TURMERIC PO Take by mouth.    [provider]  Cholecalciferol 25 MCG (1000 UT) capsule Take 1 capsule by mouth daily.    [provider]  Magnesium 250 MG TABS Take 1 tablet by mouth daily. 07/03/14   [provider]  Multiple Vitamins-Minerals (MULTIVITAMIN WOMEN 50+) TABS Take 1 tablet by mouth daily.    [provider]  Omega-3 Fatty Acids (FISH OIL) 1000 MG CAPS Take by mouth. 07/03/14   [provider]    Family History Family History  Problem Relation Age of Onset   Depression Mother    Mental illness Mother    Heart disease Father    Heart attack Father    Stroke Sister    Early death Brother    Drug abuse Brother    Alcohol abuse Brother     Social History Social History   Tobacco Use   Smoking status: Never   Smokeless tobacco: Never  Substance Use Topics   Alcohol use: Yes    Alcohol/week: 2.0 standard drinks    Types: 2 Glasses of wine per week   Drug use: Never     Allergies   Ciprofloxacin, Gluten meal, and Influenza virus vaccine   Review of Systems Review of Systems  Constitutional:  Positive for activity change. Negative for appetite change, fatigue and fever.  HENT:  Positive  for congestion, ear pain and sore throat. Negative for sinus pressure and sneezing.   Eyes:  Negative for photophobia and visual disturbance.  Respiratory:  Negative for cough and shortness of breath.   Cardiovascular:  Negative for chest pain.  Gastrointestinal:  Positive for nausea. Negative for abdominal pain, diarrhea and vomiting.  Musculoskeletal:  Negative for arthralgias and myalgias.  Neurological:  Positive for light-headedness and headaches. Negative for dizziness, syncope, facial asymmetry, speech difficulty, weakness and numbness.    Physical Exam Triage Vital Signs ED Triage Vitals [10/05/21 1700]   Enc Vitals Group     BP (!) 189/69     Pulse Rate 60     Resp 18     Temp 98.4 F (36.9 C)     Temp Source Oral     SpO2 97 %     Weight      Height      Head Circumference      Peak Flow      Pain Score 5     Pain Loc      Pain Edu?      Excl. in GC?    Orthostatic VS for the past 24 hrs:  BP- Lying Pulse- Lying BP- Sitting Pulse- Sitting BP- Standing at 0 minutes Pulse- Standing at 0 minutes  10/05/21 1856 189/69 60 176/71 60 170/66 60    Updated Vital Signs BP (!) 189/69 (BP Location: Right Arm)   Pulse 60   Temp 98.4 F (36.9 C) (Oral)   Resp 18   SpO2 97%   Visual Acuity Right Eye Distance:   Left Eye Distance:   Bilateral Distance:    Right Eye Near:   Left Eye Near:    Bilateral Near:     Physical Exam Vitals reviewed.  Constitutional:      General: She is awake. She is not in acute distress.    Appearance: Normal appearance. She is well-developed. She is not ill-appearing.     Comments: Very pleasant female appears stated age in no acute distress sitting comfortably in exam room  HENT:     Head: Normocephalic and atraumatic. No raccoon eyes, Battle's sign or contusion.     Right Ear: Tympanic membrane, ear canal and external ear normal. No hemotympanum.     Left Ear: Tympanic membrane, ear canal and external ear normal. No hemotympanum.     Nose: Nose normal.     Mouth/Throat:     Tongue: Tongue does not deviate from midline.     Pharynx: Uvula midline. No oropharyngeal exudate or posterior oropharyngeal erythema.  Eyes:     Extraocular Movements: Extraocular movements intact.     Conjunctiva/sclera: Conjunctivae normal.     Pupils: Pupils are equal, round, and reactive to light.  Cardiovascular:     Rate and Rhythm: Normal rate and regular rhythm.     Heart sounds: Normal heart sounds, S1 normal and S2 normal. No murmur heard. Pulmonary:     Effort: Pulmonary effort is normal.     Breath sounds: Normal breath sounds. No wheezing, rhonchi or  rales.     Comments: Clear to auscultation bilaterally Musculoskeletal:     Cervical back: Normal range of motion and neck supple.     Comments: Strength 5/5 bilateral upper and lower extremities  Lymphadenopathy:     Head:     Right side of head: No submental, submandibular or tonsillar adenopathy.     Left side of head: No submental, submandibular or tonsillar adenopathy.  Neurological:     General: No focal deficit present.     Mental Status: She is alert and oriented to person, place, and time.     Cranial Nerves: Cranial nerves 2-12 are intact.     Motor: Motor function is intact.     Coordination: Coordination is intact.     Gait: Gait is intact.  Psychiatric:        Behavior: Behavior is cooperative.     UC Treatments / Results  Labs (all labs ordered are listed, but only abnormal results are displayed) Labs Reviewed  POCT URINALYSIS DIPSTICK, ED / UC - Abnormal; Notable for the following components:      Result Value   Ketones, ur 40 (*)    Leukocytes,Ua SMALL (*)    All other components within normal limits  CBG MONITORING, ED - Abnormal; Notable for the following components:   Glucose-Capillary 111 (*)    All other components within normal limits  URINE CULTURE  COMPREHENSIVE METABOLIC PANEL  CBC WITH DIFFERENTIAL/PLATELET    EKG   Radiology No results found.  Procedures Procedures (including critical care time)  Medications Ordered in UC Medications - No data to display  Initial Impression / Assessment and Plan / UC Course  I have reviewed the triage vital signs and the nursing notes.  Pertinent labs & imaging results that were available during my care of the patient were reviewed by me and considered in my medical decision making (see chart for details).      EKG obtained showed atrial paced rhythm with ventricular rate of 60 bpm without ischemic changes and no significant change compared to 07/03/2021 tracing.  Blood sugar was appropriate.  UA did  show ketones and small leukocyte esterase.  Discussed with patient leukocyte esterase and she reports a history of recurrent urinary tract infections.  She is concerned that that could be contributing to current symptoms we will start Keflex.  Culture was obtained and we discussed potential need to change antibiotics based on susceptibilities identified on culture.  CBC and CMP were obtained today-results pending.  We will contact patient with these results if they change our treatment plan.  Orthostatic vital signs were appropriate.  Discussed that she is to push fluids and eat small frequent meals.  She is to follow-up with her PCP first thing next week.  If she has any worsening symptoms including syncope, near syncope, lightheadedness, shortness of breath, chest pain, nausea/vomiting she needs to go to the emergency room over the weekend to which she expressed understanding.  Final Clinical Impressions(s) / UC Diagnoses   Final diagnoses:  Episodic lightheadedness  Near syncope  Dysuria  Recurrent UTI     Discharge Instructions      Your EKG looked normal.  Your glucose is normal.  I will contact you for lab work is abnormal.  We are going to treat you for urinary tract infection in case this is contributing to your symptoms.  Start Keflex as prescribed.  Make sure you rest and drink plenty of fluid.  Eat small frequent meals.  Follow-up with your primary care first thing next week.  If anything worsens and you have chest pain, shortness of breath, passout, have recurrent episodes of feeling like you get a pass out, nausea/vomiting need to go to the emergency room as we discussed.     ED Prescriptions     Medication Sig Dispense Auth. Provider   cephALEXin (KEFLEX) 500 MG capsule Take 1 capsule (500 mg total)  by mouth 4 (four) times daily. 20 capsule Lasean Rahming, Noberto Retort, PA-C      PDMP not reviewed this encounter.   Jeani Hawking, PA-C 10/05/21 1916

## 2021-10-07 LAB — URINE CULTURE: Culture: <10000 — AB

## 2021-10-18 ENCOUNTER — Encounter: Payer: Self-pay | Admitting: Cardiology

## 2021-11-02 ENCOUNTER — Encounter: Payer: Self-pay | Admitting: Family

## 2021-11-02 ENCOUNTER — Emergency Department (HOSPITAL_BASED_OUTPATIENT_CLINIC_OR_DEPARTMENT_OTHER)
Admission: EM | Admit: 2021-11-02 | Discharge: 2021-11-02 | Discharge status: Against Medical Advice | Payer: BC Managed Care – PPO | Attending: Emergency Medicine | Admitting: Emergency Medicine

## 2021-11-02 ENCOUNTER — Encounter (HOSPITAL_BASED_OUTPATIENT_CLINIC_OR_DEPARTMENT_OTHER): Payer: Self-pay | Admitting: Emergency Medicine

## 2021-11-02 ENCOUNTER — Other Ambulatory Visit: Payer: Self-pay

## 2021-11-02 DIAGNOSIS — M79606 Pain in leg, unspecified: Secondary | ICD-10-CM | POA: Diagnosis not present

## 2021-11-02 DIAGNOSIS — M25559 Pain in unspecified hip: Secondary | ICD-10-CM | POA: Insufficient documentation

## 2021-11-02 DIAGNOSIS — R42 Dizziness and giddiness: Secondary | ICD-10-CM | POA: Insufficient documentation

## 2021-11-02 DIAGNOSIS — Z5321 Procedure and treatment not carried out due to patient leaving prior to being seen by health care provider: Secondary | ICD-10-CM | POA: Insufficient documentation

## 2021-11-02 LAB — BASIC METABOLIC PANEL
Anion gap: 7 (ref 5–15)
BUN: 25 mg/dL — ABNORMAL HIGH (ref 8–23)
CO2: 26 mmol/L (ref 22–32)
Calcium: 10 mg/dL (ref 8.9–10.3)
Chloride: 100 mmol/L (ref 98–111)
Creatinine, Ser: 0.92 mg/dL (ref 0.44–1.00)
GFR, Estimated: >60 mL/min (ref >60)
Glucose, Bld: 106 mg/dL — ABNORMAL HIGH (ref 70–99)
Potassium: 3.6 mmol/L (ref 3.5–5.1)
Sodium: 133 mmol/L — ABNORMAL LOW (ref 135–145)

## 2021-11-02 LAB — URINALYSIS, ROUTINE W REFLEX MICROSCOPIC
Bilirubin Urine: NEGATIVE
Glucose, UA: NEGATIVE mg/dL
Hgb urine dipstick: NEGATIVE
Ketones, ur: NEGATIVE mg/dL
Leukocytes,Ua: NEGATIVE
Nitrite: NEGATIVE
Protein, ur: NEGATIVE mg/dL
Specific Gravity, Urine: 1.01 (ref 1.005–1.030)
pH: 6 (ref 5.0–8.0)

## 2021-11-02 LAB — CBC
HCT: 36.1 % (ref 36.0–46.0)
Hemoglobin: 11.9 g/dL — ABNORMAL LOW (ref 12.0–15.0)
MCH: 30.1 pg (ref 26.0–34.0)
MCHC: 33 g/dL (ref 30.0–36.0)
MCV: 91.2 fL (ref 80.0–100.0)
Platelets: 288 10*3/uL (ref 150–400)
RBC: 3.96 MIL/uL (ref 3.87–5.11)
RDW: 13 % (ref 11.5–15.5)
WBC: 6.7 10*3/uL (ref 4.0–10.5)
nRBC: 0 % (ref 0.0–0.2)

## 2021-11-02 NOTE — Telephone Encounter (Signed)
Patient called and stated she is having the following symptoms: UTI, pain when urinating, Pain on both sides, pain in legs and knees, feeling loopy, dizzy and making her feel stupid". Patient was transferred to Triage.

## 2021-11-02 NOTE — Telephone Encounter (Signed)
Noted  

## 2021-11-02 NOTE — ED Notes (Signed)
Pt was seen leaving by registration 

## 2021-11-02 NOTE — ED Triage Notes (Signed)
Dizziness, leg and hip pain started a few days ago now getting worse.  Now UTI type symptoms as well burning when urinating.

## 2021-11-03 ENCOUNTER — Other Ambulatory Visit: Payer: Self-pay

## 2021-11-03 ENCOUNTER — Encounter (HOSPITAL_BASED_OUTPATIENT_CLINIC_OR_DEPARTMENT_OTHER): Payer: Self-pay

## 2021-11-03 ENCOUNTER — Observation Stay (HOSPITAL_BASED_OUTPATIENT_CLINIC_OR_DEPARTMENT_OTHER)
Admission: EM | Admit: 2021-11-03 | Discharge: 2021-11-04 | Disposition: A | Discharge status: Home / Self Care | Payer: BC Managed Care – PPO | Attending: Internal Medicine | Admitting: Internal Medicine

## 2021-11-03 ENCOUNTER — Observation Stay (HOSPITAL_COMMUNITY): Payer: BC Managed Care – PPO

## 2021-11-03 ENCOUNTER — Emergency Department (HOSPITAL_BASED_OUTPATIENT_CLINIC_OR_DEPARTMENT_OTHER): Payer: BC Managed Care – PPO | Admitting: Radiology

## 2021-11-03 DIAGNOSIS — M4802 Spinal stenosis, cervical region: Secondary | ICD-10-CM | POA: Diagnosis not present

## 2021-11-03 DIAGNOSIS — Z950 Presence of cardiac pacemaker: Secondary | ICD-10-CM | POA: Insufficient documentation

## 2021-11-03 DIAGNOSIS — Z79899 Other long term (current) drug therapy: Secondary | ICD-10-CM | POA: Insufficient documentation

## 2021-11-03 DIAGNOSIS — Z20822 Contact with and (suspected) exposure to covid-19: Secondary | ICD-10-CM | POA: Diagnosis not present

## 2021-11-03 DIAGNOSIS — M5430 Sciatica, unspecified side: Principal | ICD-10-CM

## 2021-11-03 DIAGNOSIS — I10 Essential (primary) hypertension: Secondary | ICD-10-CM | POA: Diagnosis present

## 2021-11-03 DIAGNOSIS — R42 Dizziness and giddiness: Secondary | ICD-10-CM

## 2021-11-03 DIAGNOSIS — M5431 Sciatica, right side: Secondary | ICD-10-CM

## 2021-11-03 DIAGNOSIS — R300 Dysuria: Secondary | ICD-10-CM | POA: Diagnosis present

## 2021-11-03 DIAGNOSIS — M1612 Unilateral primary osteoarthritis, left hip: Secondary | ICD-10-CM

## 2021-11-03 HISTORY — DX: Dizziness and giddiness: R42

## 2021-11-03 LAB — URINALYSIS, ROUTINE W REFLEX MICROSCOPIC
Bilirubin Urine: NEGATIVE
Glucose, UA: NEGATIVE mg/dL
Hgb urine dipstick: NEGATIVE
Ketones, ur: NEGATIVE mg/dL
Leukocytes,Ua: NEGATIVE
Nitrite: NEGATIVE
Protein, ur: NEGATIVE mg/dL
Specific Gravity, Urine: 1.012 (ref 1.005–1.030)
pH: 7 (ref 5.0–8.0)

## 2021-11-03 LAB — CBC WITH DIFFERENTIAL/PLATELET
Abs Immature Granulocytes: 0.01 10*3/uL (ref 0.00–0.07)
Basophils Absolute: 0.1 10*3/uL (ref 0.0–0.1)
Basophils Relative: 1 %
Eosinophils Absolute: 0.1 10*3/uL (ref 0.0–0.5)
Eosinophils Relative: 2 %
HCT: 36.7 % (ref 36.0–46.0)
Hemoglobin: 12.1 g/dL (ref 12.0–15.0)
Immature Granulocytes: 0 %
Lymphocytes Relative: 24 %
Lymphs Abs: 1.5 10*3/uL (ref 0.7–4.0)
MCH: 30.1 pg (ref 26.0–34.0)
MCHC: 33 g/dL (ref 30.0–36.0)
MCV: 91.3 fL (ref 80.0–100.0)
Monocytes Absolute: 0.5 10*3/uL (ref 0.1–1.0)
Monocytes Relative: 7 %
Neutro Abs: 4.2 10*3/uL (ref 1.7–7.7)
Neutrophils Relative %: 66 %
Platelets: 293 10*3/uL (ref 150–400)
RBC: 4.02 MIL/uL (ref 3.87–5.11)
RDW: 12.9 % (ref 11.5–15.5)
WBC: 6.4 10*3/uL (ref 4.0–10.5)
nRBC: 0 % (ref 0.0–0.2)

## 2021-11-03 LAB — COMPREHENSIVE METABOLIC PANEL
ALT: 16 U/L (ref 0–44)
AST: 24 U/L (ref 15–41)
Albumin: 4.3 g/dL (ref 3.5–5.0)
Alkaline Phosphatase: 35 U/L — ABNORMAL LOW (ref 38–126)
Anion gap: 8 (ref 5–15)
BUN: 22 mg/dL (ref 8–23)
CO2: 27 mmol/L (ref 22–32)
Calcium: 9.6 mg/dL (ref 8.9–10.3)
Chloride: 104 mmol/L (ref 98–111)
Creatinine, Ser: 0.87 mg/dL (ref 0.44–1.00)
GFR, Estimated: >60 mL/min (ref >60)
Glucose, Bld: 99 mg/dL (ref 70–99)
Potassium: 4.1 mmol/L (ref 3.5–5.1)
Sodium: 139 mmol/L (ref 135–145)
Total Bilirubin: 0.6 mg/dL (ref 0.3–1.2)
Total Protein: 6.8 g/dL (ref 6.5–8.1)

## 2021-11-03 LAB — TROPONIN I (HIGH SENSITIVITY)
Troponin I (High Sensitivity): 3 ng/L (ref <18)
Troponin I (High Sensitivity): 5 ng/L (ref <18)

## 2021-11-03 LAB — RESP PANEL BY RT-PCR (FLU A&B, COVID) ARPGX2
Influenza A by PCR: NEGATIVE
Influenza B by PCR: NEGATIVE
SARS Coronavirus 2 by RT PCR: NEGATIVE

## 2021-11-03 LAB — HEMOGLOBIN A1C
Hgb A1c MFr Bld: 5.3 % (ref 4.8–5.6)
Mean Plasma Glucose: 105.41 mg/dL

## 2021-11-03 LAB — GROUP A STREP BY PCR: Group A Strep by PCR: NOT DETECTED

## 2021-11-03 MED ORDER — ADULT MULTIVITAMIN W/MINERALS CH
1.0000 | ORAL_TABLET | Freq: Every day | ORAL | Status: DC
  Administered 2021-11-04: 1 via ORAL
  Filled 2021-11-03: qty 1

## 2021-11-03 MED ORDER — AMLODIPINE BESYLATE 5 MG PO TABS
5.0000 mg | ORAL_TABLET | Freq: Every day | ORAL | Status: DC
  Administered 2021-11-04: 5 mg via ORAL
  Filled 2021-11-03: qty 1

## 2021-11-03 MED ORDER — OMEGA-3-ACID ETHYL ESTERS 1 G PO CAPS
1.0000 g | ORAL_CAPSULE | Freq: Every day | ORAL | Status: DC
  Administered 2021-11-04: 1 g via ORAL
  Filled 2021-11-03: qty 1

## 2021-11-03 MED ORDER — ACETAMINOPHEN 325 MG PO TABS: 650.0000 mg | ORAL_TABLET | ORAL | Status: DC | PRN

## 2021-11-03 MED ORDER — MULTIVITAMIN WOMEN 50+ PO TABS: 1.0000 | ORAL_TABLET | Freq: Every day | ORAL | Status: DC

## 2021-11-03 MED ORDER — MECLIZINE HCL 12.5 MG PO TABS: 12.5000 mg | ORAL_TABLET | Freq: Two times a day (BID) | ORAL | Status: DC | PRN

## 2021-11-03 MED ORDER — MAGNESIUM 250 MG PO TABS: 1.0000 | ORAL_TABLET | Freq: Every day | ORAL | Status: DC

## 2021-11-03 MED ORDER — VITAMIN D3 25 MCG (1000 UNIT) PO TABS
1000.0000 [IU] | ORAL_TABLET | Freq: Every day | ORAL | Status: DC
  Administered 2021-11-04: 1000 [IU] via ORAL
  Filled 2021-11-03 (×2): qty 1

## 2021-11-03 MED ORDER — ENOXAPARIN SODIUM 40 MG/0.4ML IJ SOSY
40.0000 mg | PREFILLED_SYRINGE | INTRAMUSCULAR | Status: DC
  Administered 2021-11-03: 40 mg via SUBCUTANEOUS
  Filled 2021-11-03: qty 0.4

## 2021-11-03 MED ORDER — SENNOSIDES-DOCUSATE SODIUM 8.6-50 MG PO TABS: 1.0000 | ORAL_TABLET | Freq: Every evening | ORAL | Status: DC | PRN

## 2021-11-03 MED ORDER — ACETAMINOPHEN 160 MG/5ML PO SOLN: 650.0000 mg | ORAL | Status: DC | PRN

## 2021-11-03 MED ORDER — ASPIRIN 81 MG PO TBEC
81.0000 mg | DELAYED_RELEASE_TABLET | Freq: Every day | ORAL | Status: DC
Start: 2021-11-03 — End: 2021-11-04
  Administered 2021-11-03 – 2021-11-04 (×2): 81 mg via ORAL
  Filled 2021-11-03 (×2): qty 1

## 2021-11-03 MED ORDER — MAGNESIUM OXIDE -MG SUPPLEMENT 400 (240 MG) MG PO TABS
200.0000 mg | ORAL_TABLET | Freq: Every day | ORAL | Status: DC
Start: 2021-11-04 — End: 2021-11-04
  Administered 2021-11-04: 200 mg via ORAL
  Filled 2021-11-03: qty 1

## 2021-11-03 MED ORDER — MECLIZINE HCL 25 MG PO TABS
25.0000 mg | ORAL_TABLET | Freq: Once | ORAL | Status: AC
  Administered 2021-11-03: 25 mg via ORAL
  Filled 2021-11-03: qty 1

## 2021-11-03 MED ORDER — SODIUM CHLORIDE 0.9 % IV BOLUS
1000.0000 mL | Freq: Once | INTRAVENOUS | Status: AC
  Administered 2021-11-03: 1000 mL via INTRAVENOUS

## 2021-11-03 MED ORDER — KETOROLAC TROMETHAMINE 30 MG/ML IJ SOLN
30.0000 mg | Freq: Once | INTRAMUSCULAR | Status: AC
  Administered 2021-11-03: 30 mg via INTRAVENOUS
  Filled 2021-11-03: qty 1

## 2021-11-03 MED ORDER — STROKE: EARLY STAGES OF RECOVERY BOOK: Freq: Once | Status: DC

## 2021-11-03 MED ORDER — HYDRALAZINE HCL 25 MG PO TABS: 25.0000 mg | ORAL_TABLET | Freq: Four times a day (QID) | ORAL | Status: DC | PRN

## 2021-11-03 MED ORDER — ACETAMINOPHEN 650 MG RE SUPP: 650.0000 mg | RECTAL | Status: DC | PRN

## 2021-11-03 MED ORDER — LIDOCAINE 5 % EX PTCH
1.0000 | MEDICATED_PATCH | CUTANEOUS | Status: DC
  Administered 2021-11-03: 1 via TRANSDERMAL
  Filled 2021-11-03: qty 1

## 2021-11-03 NOTE — ED Triage Notes (Signed)
"  I am prone to UTI and I feel like I have one, burning when I urinate. I also have had a little sore throat and dizziness from allergies. My legs hurt as well" per patient

## 2021-11-03 NOTE — ED Provider Notes (Signed)
[facility] EMERGENCY DEPT Provider Note   CSN: [id number] Arrival date & time: 11/03/21  3716     History  Chief Complaint  Patient presents with   Dysuria    Mallory Adams is a 71 y.o. female.  Pt is a 71 yo female with a pmhx significant for frequent UTIs, htn and symptomatic bradycardia requiring a pacemaker.  Pt said she has not been feeling right for 1 week.  She has a sore throat and feels a little dizzy.  She feels off balance.  She said both legs hurt.  She is worried she has a UTI as she can feel this way when she gets an infection.  She denies f/c.       Home Medications Prior to Admission medications   Medication Sig Start Date End Date Taking? Authorizing Provider  amLODipine (NORVASC) 5 MG tablet Take 1 tablet (5 mg total) by mouth daily. 09/28/21 12/27/21  Dulce Sellar, NP  BLACK PEPPER-TURMERIC PO Take by mouth.    [provider]  cephALEXin (KEFLEX) 500 MG capsule Take 1 capsule (500 mg total) by mouth 4 (four) times daily. 10/05/21   Raspet, Noberto Retort, PA-C  Cholecalciferol 25 MCG (1000 UT) capsule Take 1 capsule by mouth daily.    [provider]  Magnesium 250 MG TABS Take 1 tablet by mouth daily. 07/03/14   [provider]  Multiple Vitamins-Minerals (MULTIVITAMIN WOMEN 50+) TABS Take 1 tablet by mouth daily.    [provider]  Omega-3 Fatty Acids (FISH OIL) 1000 MG CAPS Take by mouth. 07/03/14   [provider]      Allergies    Ciprofloxacin, Gluten meal, and Influenza virus vaccine    Review of Systems   Review of Systems  HENT:  Positive for sore throat.   Genitourinary:  Positive for dysuria.  Musculoskeletal:        Bilateral leg pain  Neurological:  Positive for dizziness.  All other systems reviewed and are negative.   Physical Exam Updated Vital Signs BP (!) 164/55   Pulse 61   Temp 98 F (36.7 C) (Oral)   Resp 10   Ht 5\' 5"  (1.651 m)   Wt 61.2 kg   SpO2 100%    BMI 22.47 kg/m  Physical Exam Vitals and nursing note reviewed.  Constitutional:      Appearance: Normal appearance.  HENT:     Head: Normocephalic and atraumatic.     Right Ear: External ear normal.     Left Ear: External ear normal.     Nose: Nose normal.     Mouth/Throat:     Mouth: Mucous membranes are moist.     Pharynx: Oropharynx is clear.  Eyes:     Extraocular Movements: Extraocular movements intact.     Conjunctiva/sclera: Conjunctivae normal.     Pupils: Pupils are equal, round, and reactive to light.  Cardiovascular:     Rate and Rhythm: Normal rate and regular rhythm.     Pulses: Normal pulses.     Heart sounds: Normal heart sounds.  Pulmonary:     Effort: Pulmonary effort is normal.     Breath sounds: Normal breath sounds.  Abdominal:     General: Abdomen is flat. Bowel sounds are normal.     Palpations: Abdomen is soft.  Musculoskeletal:        General: Normal range of motion.     Cervical back: Normal range of motion and neck supple.  Skin:  General: Skin is warm.     Capillary Refill: Capillary refill takes less than 2 seconds.  Neurological:     General: No focal deficit present.     Mental Status: She is alert and oriented to person, place, and time.     Comments: Dizzy with movement  Psychiatric:        Mood and Affect: Mood normal.        Behavior: Behavior normal.     ED Results / Procedures / Treatments   Labs (all labs ordered are listed, but only abnormal results are displayed) Labs Reviewed  COMPREHENSIVE METABOLIC PANEL - Abnormal; Notable for the following components:      Result Value   Alkaline Phosphatase 35 (*)    All other components within normal limits  RESP PANEL BY RT-PCR (FLU A&B, COVID) ARPGX2  GROUP A STREP BY PCR  URINALYSIS, ROUTINE W REFLEX MICROSCOPIC  CBC WITH DIFFERENTIAL/PLATELET  TROPONIN I (HIGH SENSITIVITY)  TROPONIN I (HIGH SENSITIVITY)    EKG EKG Interpretation  Date/Time:  Saturday November 03 2021  10:20:18 EDT Ventricular Rate:  60 PR Interval:  156 QRS Duration: 99 QT Interval:  427 QTC Calculation: 427 R Axis:   74 Text Interpretation: Atrial-paced rhythm No significant change since last tracing Confirmed by Jacalyn Lefevre [phone]) on 11/03/2021 10:40:02 AM  Radiology DG Chest 2 View  Result Date: 11/03/2021 CLINICAL DATA:  71 year old female with cough and dizziness. EXAM: CHEST - 2 VIEW COMPARISON:  Chest radiographs 06/05/2021. FINDINGS: Large lung volumes. Stable left chest dual lead cardiac pacemaker. Normal cardiac size and mediastinal contours. Visualized tracheal air column is within normal limits. Both lungs appear clear. No pneumothorax or pleural effusion. Abdominal Calcified aortic atherosclerosis. No acute osseous abnormality identified. Negative visible bowel gas. IMPRESSION: Evidence of chronic hyperinflation. No acute cardiopulmonary abnormality. Electronically Signed   By: Odessa Fleming M.D.   On: 11/03/2021 09:52    Procedures Procedures    Medications Ordered in ED Medications  sodium chloride 0.9 % bolus 1,000 mL (1,000 mLs Intravenous New Bag/Given 11/03/21 1015)  ketorolac (TORADOL) 30 MG/ML injection 30 mg (30 mg Intravenous Given 11/03/21 1013)  meclizine (ANTIVERT) tablet 25 mg (25 mg Oral Given 11/03/21 1012)    ED Course/ Medical Decision Making/ A&P                           Medical Decision Making Amount and/or Complexity of Data Reviewed Labs: ordered. Radiology: ordered.  Risk Prescription drug management. Decision regarding hospitalization.   This patient presents to the ED for concern of dizziness, this involves an extensive number of treatment options, and is a complaint that carries with it a high risk of complications and morbidity.  The differential diagnosis includes vertigo, cva, infection   Co morbidities that complicate the patient evaluation  frequent UTIs, htn and symptomatic bradycardia requiring a pacemaker   Additional history  obtained:  Additional history obtained from epic chart review    Lab Tests:  I Ordered, and personally interpreted labs.  The pertinent results include:  cbc nl, ua nl, cmp nl, strep neg, covid/flu neg   Imaging Studies ordered:  I ordered imaging studies including cxr  I independently visualized and interpreted imaging which showed  IMPRESSION:  Evidence of chronic hyperinflation. No acute cardiopulmonary  abnormality.   I agree with the radiologist interpretation   Cardiac Monitoring:  The patient was maintained on a cardiac monitor.  I personally viewed and  interpreted the cardiac monitored which showed an underlying rhythm of: nsr   Medicines ordered and prescription drug management:  I ordered medication including antivert  for dizziness  Reevaluation of the patient after these medicines showed that the patient improved I have reviewed the patients home medicines and have made adjustments as needed   Test Considered:  CT head:   unavailable at [facility].  MRI:  unavailable and pt has a pacemaker   Critical Interventions:  Neuro consult   Consultations Obtained:  I requested consultation with the neurologist (Dr. Thomasena Edis),  and discussed lab and imaging findings as well as pertinent plan - he recommends admission for MRI.  She will need to have a tech to turn off her pacemaker.  Problem List / ED Course:  Dizziness:  likely vertigo.  However, pt still feels dizzy with walking.  We are unable to get a CT scan here at [facility] today as the scanner is down.  As sx are c/w a posterior stroke, she will need a MRI to r/o.  Her pacemaker is ok for MRI.  I called MRI and they don't have the tech available today.  Due to this, I can't just send her for MRI and d/c if nl.  She was d/w Dr. Zenaida Niece ([facility]) who will admit. Hx pacemaker:  pacemaker interrogated.  No recent abn.  Pacemaker working well.  Pt has had some VT and SVT, but none recently.  Report photographed and is  in the media section.   Reevaluation:  After the interventions noted above, I reevaluated the patient and found that they have :improved   Social Determinants of Health:  Lives at home   Dispostion:  After consideration of the diagnostic results and the patients response to treatment, I feel that the patent would benefit from admission.          Final Clinical Impression(s) / ED Diagnoses Final diagnoses:  Dizziness    Rx / DC Orders ED Discharge Orders     None         Jacalyn Lefevre, MD 11/03/21 1450

## 2021-11-03 NOTE — ED Notes (Signed)
Ambulatory to bathroom. Gait steady. No signs of distress.  °

## 2021-11-03 NOTE — Progress Notes (Signed)
NEW ADMISSION NOTE New Admission Note:   Arrival Method: stretcher Mental Orientation: A&OX4 Telemetry:5M04 Assessment: Completed Skin:INTACT  IV:RAC Pain:0/10 Tubes:NONE Safety Measures: Safety Fall Prevention Plan has been given, discussed and signed Admission: Completed [facility] Orientation: Patient has been orientated to the room, unit and staff.  Family:None at bedside  Orders have been reviewed and implemented. Will continue to monitor the patient. Call light has been placed within reach and bed alarm has been activated.   Mairyn Lenahan S Bandon Sherwin, RN

## 2021-11-03 NOTE — Progress Notes (Signed)
[facility] night cross cover note:   I was contacted by RN with request to assess for transfer to the neuro floor.  No interval changes in neurologic status reported, but rather this patient was admitted via transfer from [facility] earlier in the day to the medical floor after presenting for further evaluation of dizziness, including stroke evaluation.  I subsequently placed orders to transfer/change level of care to the neuro floor.     Newton Pigg, DO Hospitalist

## 2021-11-03 NOTE — H&P (Signed)
History and Physical    Mallory Adams [id number] DOB: March 29, 1951 DOA: 11/03/2021  PCP: Jeanie Sewer, NP (Confirm with patient/family/NH records and if not entered, this has to be entered at [facility] point of entry) Patient coming from: Home  I have personally briefly reviewed patient's old medical records in [place]  Chief Complaint: Dizziness, worsening of leg pain  HPI: Mallory Adams is a 71 y.o. female with medical history significant of HTN, bradycardia status post pacemaker 2022, chronic back pain, sciatica, cervical spine stenosis, came with new onset of right-sided leg pain and dizziness.  Patient has chronic back pain and sciatica, at baseline, mainly, her symptoms located on the left side, admitted claudications.  She fears about surgery and injections and has not seek any specialty advice.  At baseline she ambulates without cane or walker.  Last weeks, she started to have severe " bone pain" in the right leg, dull like, shooting down from right hip to back of right shin, worsening with ambulation, she has to take frequent rest and even lying down to ease her symptoms.  Accompanying with the leg pain, she also started to have frequent episode of dizziness, which she describes as lightheadedness.  But she denies any weakness numbness of any of the limbs, no nauseous vomiting.  Denies any chest pain palpitations.  She had episode of UTI recently, which also give her feeling of dizziness.  She called her PCP Friday who recommended she come to ED.  Denies any abdominal pain, no dysuria no fever or chills.  ED Course: Blood pressure significantly elevated.  UA negative for UTI, CBC BMP largely normal.  Review of Systems: As per HPI otherwise 14 point review of systems negative.    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 2022    History reviewed. No pertinent surgical history.   reports that she has never smoked. She has never used smokeless tobacco. She reports current alcohol use of about 2.0 standard drinks of alcohol per week. She reports that she does not use drugs.  Allergies  Allergen Reactions   Ciprofloxacin    Gluten Meal     Other reaction(s): Other Esophageal spasms and acid reflux, making it difficult to breathe   Influenza Virus Vaccine     Other reaction(s): Unknown    Family History  Problem Relation Age of Onset   Depression Mother    Mental illness Mother    Heart disease Father    Heart attack Father    Stroke Sister    Early death Brother    Drug abuse Brother    Alcohol abuse Brother      Prior to Admission medications   Medication Sig Start Date End Date Taking? Authorizing Provider  amLODipine (NORVASC) 5 MG tablet Take 1 tablet (5 mg total) by mouth daily. 09/28/21 12/27/21  Jeanie Sewer, NP  BLACK PEPPER-TURMERIC PO Take by mouth.    [provider]  cephALEXin (KEFLEX) 500 MG capsule Take 1 capsule (500 mg total) by mouth 4 (four) times daily. 10/05/21   Raspet, Derry Skill, PA-C  Cholecalciferol 25 MCG (1000 UT) capsule Take 1 capsule by mouth daily.    [provider]  Magnesium 250 MG TABS Take 1 tablet  by mouth daily. 07/03/14   [provider]  Multiple Vitamins-Minerals (MULTIVITAMIN WOMEN 50+) TABS Take 1 tablet by mouth daily.    [provider]  Omega-3 Fatty Acids (FISH OIL) 1000 MG CAPS Take by mouth. 07/03/14   [provider]    Physical Exam: Vitals:   11/03/21 1019 11/03/21 1115 11/03/21 1354 11/03/21 1733  BP: (!) 152/63 (!) 144/68 (!) 164/55 (!) 182/55  Pulse: (!) 59 (!) 59 61 63  Resp: 17 17 10 17   Temp:    (!) 97.5 F (36.4 C)  TempSrc:    Oral  SpO2: 100% 100% 100% 100%  Weight:      Height:        Constitutional:  NAD, calm, comfortable Vitals:   11/03/21 1019 11/03/21 1115 11/03/21 1354 11/03/21 1733  BP: (!) 152/63 (!) 144/68 (!) 164/55 (!) 182/55  Pulse: (!) 59 (!) 59 61 63  Resp: 17 17 10 17   Temp:    (!) 97.5 F (36.4 C)  TempSrc:    Oral  SpO2: 100% 100% 100% 100%  Weight:      Height:       Eyes: PERRL, lids and conjunctivae normal, no nystagmus ENMT: Mucous membranes are moist. Posterior pharynx clear of any exudate or lesions.Normal dentition.  Neck: normal, supple, no masses, no thyromegaly Respiratory: clear to auscultation bilaterally, no wheezing, no crackles. Normal respiratory effort. No accessory muscle use.  Cardiovascular: Regular rate and rhythm, no murmurs / rubs / gallops. No extremity edema. 2+ pedal pulses. No carotid bruits.  Abdomen: no tenderness, no masses palpated. No hepatosplenomegaly. Bowel sounds positive.  Musculoskeletal: no clubbing / cyanosis. No joint deformity upper and lower extremities. Good ROM, no contractures. Normal muscle tone.  Straight leg test +30 degree on the left side and 45 degrees on right side, triggered shooting down pain along the leg. Skin: no rashes, lesions, ulcers. No induration Neurologic: CN 2-12 grossly intact. Sensation intact, DTR normal. Strength 5/5 in all 4.  Psychiatric: Normal judgment and insight. Alert and oriented x 3. Normal mood.     Labs on Admission: I have personally reviewed following labs and imaging studies  CBC: Recent Labs  Lab 11/02/21 1925 11/03/21 0952  WBC 6.7 6.4  NEUTROABS  --  4.2  HGB 11.9* 12.1  HCT 36.1 36.7  MCV 91.2 91.3  PLT 288 293   Basic Metabolic Panel: Recent Labs  Lab 11/02/21 1925 11/03/21 0952  NA 133* 139  K 3.6 4.1  CL 100 104  CO2 26 27  GLUCOSE 106* 99  BUN 25* 22  CREATININE 0.92 0.87  CALCIUM 10.0 9.6   GFR: Estimated Creatinine Clearance: 54.1 mL/min (by C-G formula based on SCr of 0.87 mg/dL). Liver Function Tests: Recent Labs  Lab 11/03/21 0952  AST 24   ALT 16  ALKPHOS 35*  BILITOT 0.6  PROT 6.8  ALBUMIN 4.3   No results for input(s): "LIPASE", "AMYLASE" in the last 168 hours. No results for input(s): "AMMONIA" in the last 168 hours. Coagulation Profile: No results for input(s): "INR", "PROTIME" in the last 168 hours. Cardiac Enzymes: No results for input(s): "CKTOTAL", "CKMB", "CKMBINDEX", "TROPONINI" in the last 168 hours. BNP (last 3 results) No results for input(s): "PROBNP" in the last 8760 hours. HbA1C: No results for input(s): "HGBA1C" in the last 72 hours. CBG: No results for input(s): "GLUCAP" in the last 168 hours. Lipid Profile: No results for input(s): "CHOL", "HDL", "LDLCALC", "TRIG", "CHOLHDL", "LDLDIRECT" in the last 72  hours. Thyroid Function Tests: No results for input(s): "TSH", "T4TOTAL", "FREET4", "T3FREE", "THYROIDAB" in the last 72 hours. Anemia Panel: No results for input(s): "VITAMINB12", "FOLATE", "FERRITIN", "TIBC", "IRON", "RETICCTPCT" in the last 72 hours. Urine analysis:    Component Value Date/Time   COLORURINE YELLOW 11/03/2021 0850   APPEARANCEUR CLEAR 11/03/2021 0850   LABSPEC 1.012 11/03/2021 0850   PHURINE 7.0 11/03/2021 0850   GLUCOSEU NEGATIVE 11/03/2021 0850   HGBUR NEGATIVE 11/03/2021 0850   BILIRUBINUR NEGATIVE 11/03/2021 0850   KETONESUR NEGATIVE 11/03/2021 0850   PROTEINUR NEGATIVE 11/03/2021 0850   UROBILINOGEN 0.2 10/05/2021 1821   NITRITE NEGATIVE 11/03/2021 0850   LEUKOCYTESUR NEGATIVE 11/03/2021 0850    Radiological Exams on Admission: DG Chest 2 View  Result Date: 11/03/2021 CLINICAL DATA:  71 year old female with cough and dizziness. EXAM: CHEST - 2 VIEW COMPARISON:  Chest radiographs 06/05/2021. FINDINGS: Large lung volumes. Stable left chest dual lead cardiac pacemaker. Normal cardiac size and mediastinal contours. Visualized tracheal air column is within normal limits. Both lungs appear clear. No pneumothorax or pleural effusion. Abdominal Calcified aortic  atherosclerosis. No acute osseous abnormality identified. Negative visible bowel gas. IMPRESSION: Evidence of chronic hyperinflation. No acute cardiopulmonary abnormality. Electronically Signed   By: Odessa Fleming M.D.   On: 11/03/2021 09:52    EKG: Independently reviewed.  Atrial paced  Assessment/Plan Principal Problem:   Dizziness Active Problems:   Vertigo  (please populate well all problems here in Problem List. (For example, if patient is on BP meds at home and you resume or decide to hold them, it is a problem that needs to be her. Same for CAD, COPD, HLD and so on)  Dizziness -Peripheral versus central -As per neurology recommendation, brain MRI ordered. -Antivert as needed -Risk factor modification, start aspirin 81 mg daily, check A1c and lipid panel. -Other DDx, on cervical spine MRI done 2 years ago showed patient has significant cervical spine degenerative changes and cervical spine stenosis.  Likelihood of cervical spine related dizziness is high.  Following MRI negative, recommend patient go see outpatient neurosurgery to have updated cervical spine MRI. UTI ruled out this time.  Acute on chronic sciatica flareup -The new onset of right-sided leg pain likely reflects a poorly controlled baseline lumbar spine degenerative condition and progress of left-sided sciatica.  Patient again expressed fear of surgical intervention including any injections.  I referred her to see outpatient neurosurgery to discuss outpatient lumbar spine MRI and further treatment options.  Uncontrolled hypertension -Probably related to the pain -Restart home BP meds.   DVT prophylaxis: Lovenox Code Status: Full code Family Communication: None at bedside Disposition Plan: Expect less than 2 midnight hospital stay Consults called: Neurology Admission status: Telemetry observation   Emeline General MD [facility] Pager [phone]  11/03/2021, 6:18 PM

## 2021-11-04 ENCOUNTER — Observation Stay (HOSPITAL_BASED_OUTPATIENT_CLINIC_OR_DEPARTMENT_OTHER): Payer: BC Managed Care – PPO

## 2021-11-04 DIAGNOSIS — M5430 Sciatica, unspecified side: Secondary | ICD-10-CM

## 2021-11-04 DIAGNOSIS — R42 Dizziness and giddiness: Secondary | ICD-10-CM

## 2021-11-04 DIAGNOSIS — M5431 Sciatica, right side: Secondary | ICD-10-CM

## 2021-11-04 DIAGNOSIS — I10 Essential (primary) hypertension: Secondary | ICD-10-CM

## 2021-11-04 DIAGNOSIS — M1612 Unilateral primary osteoarthritis, left hip: Secondary | ICD-10-CM

## 2021-11-04 DIAGNOSIS — M79604 Pain in right leg: Secondary | ICD-10-CM

## 2021-11-04 DIAGNOSIS — M5432 Sciatica, left side: Secondary | ICD-10-CM | POA: Diagnosis not present

## 2021-11-04 DIAGNOSIS — M4802 Spinal stenosis, cervical region: Secondary | ICD-10-CM

## 2021-11-04 DIAGNOSIS — M79605 Pain in left leg: Secondary | ICD-10-CM | POA: Diagnosis not present

## 2021-11-04 LAB — ECHOCARDIOGRAM COMPLETE
Area-P 1/2: 3.85 cm2
Height: 65 in
P 1/2 time: 445 msec
S' Lateral: 2.6 cm
Weight: 2160 oz

## 2021-11-04 LAB — LIPID PANEL
Cholesterol: 204 mg/dL — ABNORMAL HIGH (ref 0–200)
HDL: 55 mg/dL (ref >40)
LDL Cholesterol: 119 mg/dL — ABNORMAL HIGH (ref 0–99)
Total CHOL/HDL Ratio: 3.7 RATIO
Triglycerides: 152 mg/dL — ABNORMAL HIGH (ref <150)
VLDL: 30 mg/dL (ref 0–40)

## 2021-11-04 LAB — HIV ANTIBODY (ROUTINE TESTING W REFLEX): HIV Screen 4th Generation wRfx: NONREACTIVE

## 2021-11-04 NOTE — Discharge Summary (Signed)
Physician Discharge Summary  Mallory Adams VZC:[id number] DOB: 06-27-1950 DOA: 11/03/2021  PCP: Mallory Sellar, NP  Admit date: 11/03/2021 Discharge date: 11/04/2021  Admitted From: Home Disposition:  Home  Recommendations for Outpatient Follow-up:  Follow up with PCP in 1 week Follow up with outpatient neurosurgery referral  Discharge Condition: Stable CODE STATUS: Full code Diet recommendation: Heart healthy diet  Brief/Interim Summary: From H&P by Dr. Chipper Adams: "Mallory Adams is a 71 y.o. female with medical history significant of HTN, bradycardia status post pacemaker 2022, chronic back pain, sciatica, cervical spine stenosis, came with new onset of right-sided leg pain and dizziness.   Patient has chronic back pain and sciatica, at baseline, mainly, her symptoms located on the left side, admitted claudications.  She fears about surgery and injections and has not seek any specialty advice.  At baseline she ambulates without cane or walker.   Last weeks, she started to have severe " bone pain" in the right leg, dull like, shooting down from right hip to back of right shin, worsening with ambulation, she has to take frequent rest and even lying down to ease her symptoms.  Accompanying with the leg pain, she also started to have frequent episode of dizziness, which she describes as lightheadedness.  But she denies any weakness numbness of any of the limbs, no nauseous vomiting.  Denies any chest pain palpitations.  She had episode of UTI recently, which also give her feeling of dizziness.  She called her PCP Friday who recommended she come to ED.  Denies any abdominal pain, no dysuria no fever or chills."  Neurology was consulted.  Patient's neurological exam remained normal.  CT head was negative.  Her symptoms were not thought to be consistent with neurological origin.  Patient work with physical therapy and Occupational Therapy.  She was recommended to follow-up with  outpatient PCP as well as neurosurgery.  Discharge Diagnoses:   Principal Problem:   Sciatica Active Problems:   Essential hypertension   Dizziness   Vertigo   Cervical stenosis of spine   Arthritis of left hip  Discharge Instructions  Discharge Instructions     Ambulatory referral to Neurosurgery   Complete by: As directed    Call MD for:  difficulty breathing, headache or visual disturbances   Complete by: As directed    Call MD for:  extreme fatigue   Complete by: As directed    Call MD for:  persistant dizziness or light-headedness   Complete by: As directed    Call MD for:  persistant nausea and vomiting   Complete by: As directed    Call MD for:  severe uncontrolled pain   Complete by: As directed    Call MD for:  temperature >100.4   Complete by: As directed    Diet - low sodium heart healthy   Complete by: As directed    Discharge instructions   Complete by: As directed    You were cared for by a hospitalist during your hospital stay. If you have any questions about your discharge medications or the care you received while you were in the hospital after you are discharged, you can call the unit and ask to speak with the hospitalist on call if the hospitalist that took care of you is not available. Once you are discharged, your primary care physician will handle any further medical issues. Please note that NO REFILLS for any discharge medications will be authorized once you are discharged, as it is imperative  that you return to your primary care physician (or establish a relationship with a primary care physician if you do not have one) for your aftercare needs so that they can reassess your need for medications and monitor your lab values.   Increase activity slowly   Complete by: As directed       Allergies as of 11/04/2021       Reactions   Cipro [ciprofloxacin Hcl] Other (See Comments)   Unknown reaction   Gluten Meal    Other reaction(s): Other Esophageal  spasms and acid reflux, making it difficult to breathe   Influenza Virus Vaccine    Other reaction(s): Unknown        Medication List     TAKE these medications    amLODipine 5 MG tablet Commonly known as: NORVASC Take 1 tablet (5 mg total) by mouth daily.   BLACK PEPPER-TURMERIC PO Take by mouth.   cephALEXin 500 MG capsule Commonly known as: KEFLEX Take 1 capsule (500 mg total) by mouth 4 (four) times daily.   Cholecalciferol 25 MCG (1000 UT) capsule Take 1 capsule by mouth daily.   Fish Oil 1000 MG Caps Take by mouth.   Magnesium 250 MG Tabs Take 1 tablet by mouth daily.   Multivitamin Women 50+ Tabs Take 1 tablet by mouth daily.        Follow-up Information     Mallory Sellar, NP Follow up.   Specialty: Family Medicine Contact information: [address] [place] [address] [phone]         [facility] Follow up.   Specialty: Neurosurgery Why: Outpatient Referral placed Contact information: [address] [place] [address] [phone]                Allergies  Allergen Reactions   Cipro [Ciprofloxacin Hcl] Other (See Comments)    Unknown reaction   Gluten Meal     Other reaction(s): Other Esophageal spasms and acid reflux, making it difficult to breathe   Influenza Virus Vaccine     Other reaction(s): Unknown    Consultations: Neurology    Procedures/Studies: VAS US CAROTID  Result Date: 11/04/2021 Carotid Arterial Duplex Study Patient Name:  Mallory Adams  Date of Exam:   11/04/2021 Medical Rec #: [id number]                Accession #:    [id number] Date of Birth: 1950-08-25               Patient Gender: F Patient Age:   71 years Exam Location:  [facility] Procedure:      VAS US CAROTID Referring Phys: Mallory Adams --------------------------------------------------------------------------------  Indications:   Dizziness. Risk Factors:   Hypertension. Other Factors: Pacemaker for bradycardia, frequent UTIs. Performing Technologist: Mallory Adams RVS  Examination Guidelines: A complete evaluation includes B-mode imaging, spectral Doppler, color Doppler, and power Doppler as needed of all accessible portions of each vessel. Bilateral testing is considered an integral part of a complete examination. Limited examinations for reoccurring indications may be performed as noted.  Right Carotid Findings: +----------+--------+--------+--------+------------------+------------------+           PSV cm/sEDV cm/sStenosisPlaque DescriptionComments           +----------+--------+--------+--------+------------------+------------------+ CCA Prox  103     14  intimal thickening +----------+--------+--------+--------+------------------+------------------+ CCA Distal91      15                                intimal thickening +----------+--------+--------+--------+------------------+------------------+ ICA Prox  90      17                                                   +----------+--------+--------+--------+------------------+------------------+ ICA Mid   100     20                                                   +----------+--------+--------+--------+------------------+------------------+ ICA Distal118     21                                                   +----------+--------+--------+--------+------------------+------------------+ ECA       138     25                                                   +----------+--------+--------+--------+------------------+------------------+ +----------+--------+-------+--------+-------------------+           PSV cm/sEDV cmsDescribeArm Pressure (mmHG) +----------+--------+-------+--------+-------------------+ [id number]                                         +----------+--------+-------+--------+-------------------+  +---------+--------+--+--------+--+ VertebralPSV cm/s86EDV cm/s12 +---------+--------+--+--------+--+  Left Carotid Findings: +----------+--------+--------+--------+------------------+------------------+           PSV cm/sEDV cm/sStenosisPlaque DescriptionComments           +----------+--------+--------+--------+------------------+------------------+ CCA Prox  111     20                                intimal thickening +----------+--------+--------+--------+------------------+------------------+ CCA Distal104     21                                intimal thickening +----------+--------+--------+--------+------------------+------------------+ ICA Prox  95      21              homogeneous                          +----------+--------+--------+--------+------------------+------------------+ ICA Mid   107     27                                                   +----------+--------+--------+--------+------------------+------------------+ ICA Distal115     27                                                   +----------+--------+--------+--------+------------------+------------------+  ECA       144     9                                                    +----------+--------+--------+--------+------------------+------------------+ +----------+--------+--------+--------+-------------------+           PSV cm/sEDV cm/sDescribeArm Pressure (mmHG) +----------+--------+--------+--------+-------------------+ [id number]                                         +----------+--------+--------+--------+-------------------+ +---------+--------+--+--------+--+ VertebralPSV cm/s82EDV cm/s20 +---------+--------+--+--------+--+   Summary: Right Carotid: The extracranial vessels were near-normal with only minimal wall                thickening or plaque. Left Carotid: The extracranial vessels were near-normal with only minimal wall               thickening or  plaque. Vertebrals:  Bilateral vertebral arteries demonstrate antegrade flow. Subclavians: Normal flow hemodynamics were seen in bilateral subclavian              arteries. *See table(s) above for measurements and observations.     Preliminary    ECHOCARDIOGRAM COMPLETE  Result Date: 11/04/2021    ECHOCARDIOGRAM REPORT   Patient Name:   Mallory Adams Date of Exam: 11/04/2021 Medical Rec #:  [id number]               Height:       65.0 in Accession #:    [id number]              Weight:       135.0 lb Date of Birth:  November 01, 1950              BSA:          1.674 m Patient Age:    70 years                BP:           136/60 mmHg Patient Gender: F                       HR:           60 bpm. Exam Location:  Inpatient Procedure: 2D Echo Indications:    TIA  History:        Patient has no prior history of Echocardiogram examinations.                 Pacemaker; Risk Factors:Hypertension.  Sonographer:    Delcie Roch RDCS Referring Phys: [id number] Rosemae Mcquown IMPRESSIONS  1. Left ventricular ejection fraction, by estimation, is 60 to 65%. The left ventricle has normal function. The left ventricle has no regional wall motion abnormalities. Left ventricular diastolic parameters were normal.  2. Right ventricular systolic function is normal. The right ventricular size is normal. There is normal pulmonary artery systolic pressure. The estimated right ventricular systolic pressure is 18.2 mmHg.  3. The mitral valve is normal in structure. Trivial mitral valve regurgitation. No evidence of mitral stenosis.  4. The aortic valve is tricuspid. Aortic valve regurgitation is mild to moderate. Aortic valve sclerosis/calcification is present, without any evidence of aortic stenosis. Aortic regurgitation PHT measures 445 msec.  5. The inferior vena cava is  normal in size with greater than 50% respiratory variability, suggesting right atrial pressure of 3 mmHg. FINDINGS  Left Ventricle: Left ventricular ejection fraction,  by estimation, is 60 to 65%. The left ventricle has normal function. The left ventricle has no regional wall motion abnormalities. The left ventricular internal cavity size was normal in size. There is  no left ventricular hypertrophy. Left ventricular diastolic parameters were normal. Normal left ventricular filling pressure. Right Ventricle: The right ventricular size is normal. No increase in right ventricular wall thickness. Right ventricular systolic function is normal. There is normal pulmonary artery systolic pressure. The tricuspid regurgitant velocity is 1.95 m/s, and  with an assumed right atrial pressure of 3 mmHg, the estimated right ventricular systolic pressure is 18.2 mmHg. Left Atrium: Left atrial size was normal in size. Right Atrium: Right atrial size was normal in size. Pericardium: There is no evidence of pericardial effusion. Mitral Valve: The mitral valve is normal in structure. Trivial mitral valve regurgitation. No evidence of mitral valve stenosis. Tricuspid Valve: The tricuspid valve is normal in structure. Tricuspid valve regurgitation is mild . No evidence of tricuspid stenosis. Aortic Valve: The aortic valve is tricuspid. Aortic valve regurgitation is mild to moderate. Aortic regurgitation PHT measures 445 msec. Aortic valve sclerosis/calcification is present, without any evidence of aortic stenosis. Pulmonic Valve: The pulmonic valve was normal in structure. Pulmonic valve regurgitation is not visualized. No evidence of pulmonic stenosis. Aorta: The aortic root is normal in size and structure. Venous: The inferior vena cava is normal in size with greater than 50% respiratory variability, suggesting right atrial pressure of 3 mmHg. IAS/Shunts: No atrial level shunt detected by color flow Doppler.  LEFT VENTRICLE PLAX 2D LVIDd:         4.00 cm   Diastology LVIDs:         2.60 cm   LV e' medial:    10.10 cm/s LV PW:         0.90 cm   LV E/e' medial:  8.0 LV IVS:        0.90 cm   LV e'  lateral:   11.30 cm/s LVOT diam:     1.60 cm   LV E/e' lateral: 7.1 LV SV:         47 LV SV Index:   28 LVOT Area:     2.01 cm  RIGHT VENTRICLE             IVC RV S prime:     14.60 cm/s  IVC diam: 1.40 cm LEFT ATRIUM             Index        RIGHT ATRIUM          Index LA diam:        3.60 cm 2.15 cm/m   RA Area:     9.73 cm LA Vol (A2C):   45.3 ml 27.06 ml/m  RA Volume:   18.80 ml 11.23 ml/m LA Vol (A4C):   36.5 ml 21.81 ml/m LA Biplane Vol: 41.1 ml 24.55 ml/m  AORTIC VALVE LVOT Vmax:   114.00 cm/s LVOT Vmean:  68.700 cm/s LVOT VTI:    0.233 m AI PHT:      445 msec  AORTA Ao Root diam: 2.50 cm MITRAL VALVE               TRICUSPID VALVE MV Area (PHT): 3.85 cm    TR Peak grad:   15.2 mmHg MV Decel Time:  197 msec    TR Vmax:        195.00 cm/s MV E velocity: 80.70 cm/s MV A velocity: 69.70 cm/s  SHUNTS MV E/A ratio:  1.16        Systemic VTI:  0.23 m                            Systemic Diam: 1.60 cm Armanda Magic MD Electronically signed by Armanda Magic MD Signature Date/Time: 11/04/2021/12:38:50 PM    Final    CT HEAD WO CONTRAST ( )  Result Date: 11/03/2021 CLINICAL DATA:  Vertigo, central EXAM: CT HEAD WITHOUT CONTRAST TECHNIQUE: Contiguous axial images were obtained from the base of the skull through the vertex without intravenous contrast. RADIATION DOSE REDUCTION: This exam was performed according to the departmental dose-optimization program which includes automated exposure control, adjustment of the mA and/or kV according to patient size and/or use of iterative reconstruction technique. COMPARISON:  None Available. FINDINGS: Brain: No evidence of large-territorial acute infarction. No parenchymal hemorrhage. No mass lesion. No extra-axial collection. No mass effect or midline shift. No hydrocephalus. Basilar cisterns are patent. Vascular: No hyperdense vessel. Skull: No acute fracture or focal lesion. Sinuses/Orbits: Paranasal sinuses and mastoid air cells are clear. The orbits are unremarkable.  Other: None. IMPRESSION: No acute intracranial abnormality. Electronically Signed   By: Tish Frederickson M.D.   On: 11/03/2021 20:15   DG HIP UNILAT WITH PELVIS 2-3 VIEWS RIGHT  Result Date: 11/03/2021 CLINICAL DATA:  [id number] EXAM: DG HIP (WITH OR WITHOUT PELVIS) 2-3V RIGHT COMPARISON:  None Available. FINDINGS: There is no evidence of hip fracture or dislocation of the right hip. No acute displaced fracture or dislocation of the left hip on frontal view. 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 No acute displaced fracture or diastasis of the bones of the pelvis. IMPRESSION: 1. No acute displaced fracture or dislocation of the right hip. 2. 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 Electronically Signed   By: Tish Frederickson M.D.   On: 11/03/2021 20:13   DG Chest 2 View  Result Date: 11/03/2021 CLINICAL DATA:  71 year old female with cough and dizziness. EXAM: CHEST - 2 VIEW COMPARISON:  Chest radiographs 06/05/2021. FINDINGS: Large lung volumes. Stable left chest dual lead cardiac pacemaker. Normal cardiac size and mediastinal contours. Visualized tracheal air column is within normal limits. Both lungs appear clear. No pneumothorax or pleural effusion. Abdominal Calcified aortic atherosclerosis. No acute osseous abnormality identified. Negative visible bowel gas. IMPRESSION: Evidence of chronic hyperinflation. No acute cardiopulmonary abnormality. Electronically Signed   By: Odessa Fleming M.D.   On: 11/03/2021 09:52       Discharge Exam: Vitals:   11/04/21 1019 11/04/21 1131  BP: (!) 131/55 (!) 146/65  Pulse: 60 (!) 59  Resp: 16 15  Temp: 98.8 F (37.1 C) 98.5 F (36.9 C)  SpO2: 98% 96%    General: Pt is alert, awake, not in acute distress Cardiovascular: RRR, S1/S2 +, no edema Respiratory: CTA bilaterally, no wheezing, no rhonchi, no respiratory distress, no conversational dyspnea  Abdominal: Soft, NT, ND, bowel sounds + Extremities: no edema, no cyanosis CNS: Alert, oriented, speech  fluent, no focal deficits, strength 5 out of 5 Psych: Normal mood and affect, stable judgement and insight     The results of significant diagnostics from this hospitalization (including imaging, microbiology, ancillary and laboratory) are listed below for reference.     Microbiology: Recent Results (from the past  240 hour(s))  Resp Panel by RT-PCR (Flu A&B, Covid) Anterior Nasal Swab     Status: None   Collection Time: 11/03/21  9:52 AM   Specimen: Anterior Nasal Swab  Result Value Ref Range Status   SARS Coronavirus 2 by RT PCR NEGATIVE NEGATIVE Final    Comment: (NOTE) SARS-CoV-2 target nucleic acids are NOT DETECTED.  The SARS-CoV-2 RNA is generally detectable in upper respiratory specimens during the acute phase of infection. The lowest concentration of SARS-CoV-2 viral copies this assay can detect is 138 copies/mL. A negative result does not preclude SARS-Cov-2 infection and should not be used as the sole basis for treatment or other patient management decisions. A negative result may occur with  improper specimen collection/handling, submission of specimen other than nasopharyngeal swab, presence of viral mutation(s) within the areas targeted by this assay, and inadequate number of viral copies(<138 copies/mL). A negative result must be combined with clinical observations, patient history, and epidemiological information. The expected result is Negative.  Fact Sheet for Patients:  BloggerCourse.comhttps://www.fda.gov/media/152166/download  Fact Sheet for Healthcare Providers:  SeriousBroker.ithttps://www.fda.gov/media/152162/download  This test is no t yet approved or cleared by the [place] FDA and  has been authorized for detection and/or diagnosis of SARS-CoV-2 by FDA under an Emergency Use Authorization (EUA). This EUA will remain  in effect (meaning this test can be used) for the duration of the COVID-19 declaration under Section 564(b)(1) of the Act, 21 U.S.C.section 360bbb-3(b)(1), unless  the authorization is terminated  or revoked sooner.       Influenza A by PCR NEGATIVE NEGATIVE Final   Influenza B by PCR NEGATIVE NEGATIVE Final    Comment: (NOTE) The Xpert Xpress SARS-CoV-2/FLU/RSV plus assay is intended as an aid in the diagnosis of influenza from Nasopharyngeal swab specimens and should not be used as a sole basis for treatment. Nasal washings and aspirates are unacceptable for Xpert Xpress SARS-CoV-2/FLU/RSV testing.  Fact Sheet for Patients: BloggerCourse.comhttps://www.fda.gov/media/152166/download  Fact Sheet for Healthcare Providers: SeriousBroker.ithttps://www.fda.gov/media/152162/download  This test is not yet approved or cleared by the [place] FDA and has been authorized for detection and/or diagnosis of SARS-CoV-2 by FDA under an Emergency Use Authorization (EUA). This EUA will remain in effect (meaning this test can be used) for the duration of the COVID-19 declaration under Section 564(b)(1) of the Act, 21 U.S.C. section 360bbb-3(b)(1), unless the authorization is terminated or revoked.  Performed at [facility], [address], [place], [place] [address]   Group A Strep by PCR     Status: None   Collection Time: 11/03/21  9:52 AM   Specimen: Anterior Nasal Swab; Sterile Swab  Result Value Ref Range Status   Group A Strep by PCR NOT DETECTED NOT DETECTED Final    Comment: Performed at [facility], [address], [place], [place] [address]     Labs: BNP (last 3 results) No results for input(s): "BNP" in the last 8760 hours. Basic Metabolic Panel: Recent Labs  Lab 11/02/21 1925 11/03/21 0952  NA 133* 139  K 3.6 4.1  CL 100 104  CO2 26 27  GLUCOSE 106* 99  BUN 25* 22  CREATININE 0.92 0.87  CALCIUM 10.0 9.6   Liver Function Tests: Recent Labs  Lab 11/03/21 0952  AST 24  ALT 16  ALKPHOS 35*  BILITOT 0.6  PROT 6.8  ALBUMIN 4.3   No results for input(s): "LIPASE", "AMYLASE" in the last 168 hours. No results  for input(s): "AMMONIA" in the last 168 hours.  CBC: Recent Labs  Lab 11/02/21 1925 11/03/21 0952  WBC 6.7 6.4  NEUTROABS  --  4.2  HGB 11.9* 12.1  HCT 36.1 36.7  MCV 91.2 91.3  PLT 288 293   Cardiac Enzymes: No results for input(s): "CKTOTAL", "CKMB", "CKMBINDEX", "TROPONINI" in the last 168 hours. BNP: Invalid input(s): "POCBNP" CBG: No results for input(s): "GLUCAP" in the last 168 hours. D-Dimer No results for input(s): "DDIMER" in the last 72 hours. Hgb A1c Recent Labs    11/03/21 1814  HGBA1C 5.3   Lipid Profile Recent Labs    11/04/21 0327  CHOL 204*  HDL 55  LDLCALC 119*  TRIG 152*  CHOLHDL 3.7   Thyroid function studies No results for input(s): "TSH", "T4TOTAL", "T3FREE", "THYROIDAB" in the last 72 hours.  Invalid input(s): "FREET3" Anemia work up No results for input(s): "VITAMINB12", "FOLATE", "FERRITIN", "TIBC", "IRON", "RETICCTPCT" in the last 72 hours. Urinalysis    Component Value Date/Time   COLORURINE YELLOW 11/03/2021 0850   APPEARANCEUR CLEAR 11/03/2021 0850   LABSPEC 1.012 11/03/2021 0850   PHURINE 7.0 11/03/2021 0850   GLUCOSEU NEGATIVE 11/03/2021 0850   HGBUR NEGATIVE 11/03/2021 0850   BILIRUBINUR NEGATIVE 11/03/2021 0850   KETONESUR NEGATIVE 11/03/2021 0850   PROTEINUR NEGATIVE 11/03/2021 0850   UROBILINOGEN 0.2 10/05/2021 1821   NITRITE NEGATIVE 11/03/2021 0850   LEUKOCYTESUR NEGATIVE 11/03/2021 0850   Sepsis Labs Recent Labs  Lab 11/02/21 1925 11/03/21 0952  WBC 6.7 6.4   Microbiology Recent Results (from the past 240 hour(s))  Resp Panel by RT-PCR (Flu A&B, Covid) Anterior Nasal Swab     Status: None   Collection Time: 11/03/21  9:52 AM   Specimen: Anterior Nasal Swab  Result Value Ref Range Status   SARS Coronavirus 2 by RT PCR NEGATIVE NEGATIVE Final    Comment: (NOTE) SARS-CoV-2 target nucleic acids are NOT DETECTED.  The SARS-CoV-2 RNA is generally detectable in upper respiratory specimens during the acute phase  of infection. The lowest concentration of SARS-CoV-2 viral copies this assay can detect is 138 copies/mL. A negative result does not preclude SARS-Cov-2 infection and should not be used as the sole basis for treatment or other patient management decisions. A negative result may occur with  improper specimen collection/handling, submission of specimen other than nasopharyngeal swab, presence of viral mutation(s) within the areas targeted by this assay, and inadequate number of viral copies(<138 copies/mL). A negative result must be combined with clinical observations, patient history, and epidemiological information. The expected result is Negative.  Fact Sheet for Patients:  BloggerCourse.com  Fact Sheet for Healthcare Providers:  SeriousBroker.it  This test is no t yet approved or cleared by the [place] FDA and  has been authorized for detection and/or diagnosis of SARS-CoV-2 by FDA under an Emergency Use Authorization (EUA). This EUA will remain  in effect (meaning this test can be used) for the duration of the COVID-19 declaration under Section 564(b)(1) of the Act, 21 U.S.C.section 360bbb-3(b)(1), unless the authorization is terminated  or revoked sooner.       Influenza A by PCR NEGATIVE NEGATIVE Final   Influenza B by PCR NEGATIVE NEGATIVE Final    Comment: (NOTE) The Xpert Xpress SARS-CoV-2/FLU/RSV plus assay is intended as an aid in the diagnosis of influenza from Nasopharyngeal swab specimens and should not be used as a sole basis for treatment. Nasal washings and aspirates are unacceptable for Xpert Xpress SARS-CoV-2/FLU/RSV testing.  Fact Sheet for Patients: BloggerCourse.com  Fact Sheet for Healthcare Providers: SeriousBroker.it  This test is not yet approved or cleared by the [place] and has been authorized for detection and/or diagnosis of  SARS-CoV-2 by FDA under an Emergency Use Authorization (EUA). This EUA will remain in effect (meaning this test can be used) for the duration of the COVID-19 declaration under Section 564(b)(1) of the Act, 21 U.S.C. section 360bbb-3(b)(1), unless the authorization is terminated or revoked.  Performed at [facility], [address] [place], [place] [address]   Group A Strep by PCR     Status: None   Collection Time: 11/03/21  9:52 AM   Specimen: Anterior Nasal Swab; Sterile Swab  Result Value Ref Range Status   Group A Strep by PCR NOT DETECTED NOT DETECTED Final    Comment: Performed at [facility], [address] [place], [place] [address]     Patient was seen and examined on the day of discharge and was found to be in stable condition. Time coordinating discharge: 35 minutes including assessment and coordination of care, as well as examination of the patient.   SIGNED:  Noralee Stain, DO [facility] 11/04/2021, 1:45 PM

## 2021-11-04 NOTE — Progress Notes (Signed)
PT Cancellation Note  Patient Details Name: Mallory Adams MRN: [id number] DOB: 10/11/1950   Cancelled Treatment:    Reason Eval/Treat Not Completed: Patient at procedure or test/unavailable;Other (comment) MD present in room and transport preparing to take for echo. PT will re-attempt at later time.   Josemanuel Eakins A. Dan Humphreys PT, DPT Acute Rehabilitation Services Office [phone]    Viviann Spare 11/04/2021, 8:30 AM

## 2021-11-04 NOTE — Progress Notes (Signed)
Patient ready for discharge; discharge instructions given and reviewed; no Rx's; discharged out via wheelchair accompanied home by her husband.

## 2021-11-04 NOTE — Progress Notes (Signed)
  Echocardiogram 2D Echocardiogram has been performed.  Mallory Adams 11/04/2021, 9:23 AM

## 2021-11-04 NOTE — Progress Notes (Signed)
VASCULAR LAB    Carotid duplex has been performed.  See CV proc for preliminary results.   Jessicca Stitzer, RVT 11/04/2021, 9:39 AM

## 2021-11-04 NOTE — Consult Note (Signed)
NEURO HOSPITALIST CONSULT NOTE   Requestig physician: Dr. Alvino Chapel  Reason for Consult: Shooting bilateral deep thigh pain and proximal left leg pain below the knee on the left precipitated by ambulation and relieved with rest  History obtained from:    Patient and Chart     HPI:                                                                                                                                          Mallory Adams is an 71 y.o. female with a PMHx of HTN, pacemaker, right ACL rupture and bradycardia who presented to [facility] on Saturday with dizziness, leg and hip pain that had started a few days previously but had been getting worse. Also had new UTI symptoms including burning sensation during urination. She also felt off balance. She was sent to [facility] for MRI brain, but states that she may not be able to obtain an MRI due to her pacemaker.   She describes the lower extremity pain as bilateral, involving the thighs with a deep pain that feels like "bony pain", which is precipitated by ambulation after walking about 2 blocks outside and relieved by rest.   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 2022    History reviewed. No pertinent surgical history.  Family History  Problem Relation Age of Onset   Depression Mother    Mental illness Mother    Heart disease Father    Heart attack Father    Stroke Sister    Early death Brother    Drug abuse Brother    Alcohol abuse Brother              Social History:  reports that she has never smoked. She has never used smokeless tobacco. She reports current alcohol use of about 2.0 standard drinks of alcohol per week. She reports that she does not use drugs.  Allergies  Allergen  Reactions   Ciprofloxacin    Gluten Meal     Other reaction(s): Other Esophageal spasms and acid reflux, making it difficult to breathe   Influenza Virus Vaccine     Other reaction(s): Unknown    MEDICATIONS:  Prior to Admission:  Medications Prior to Admission  Medication Sig Dispense Refill Last Dose   amLODipine (NORVASC) 5 MG tablet Take 1 tablet (5 mg total) by mouth daily. 90 tablet 0    BLACK PEPPER-TURMERIC PO Take by mouth.      cephALEXin (KEFLEX) 500 MG capsule Take 1 capsule (500 mg total) by mouth 4 (four) times daily. 20 capsule 0    Cholecalciferol 25 MCG (1000 UT) capsule Take 1 capsule by mouth daily.      Magnesium 250 MG TABS Take 1 tablet by mouth daily.      Multiple Vitamins-Minerals (MULTIVITAMIN WOMEN 50+) TABS Take 1 tablet by mouth daily.      Omega-3 Fatty Acids (FISH OIL) 1000 MG CAPS Take by mouth.      Scheduled:   stroke: early stages of recovery book   Does not apply Once   amLODipine  5 mg Oral Daily   aspirin EC  81 mg Oral Daily   cholecalciferol  1,000 Units Oral Daily   enoxaparin (LOVENOX) injection  40 mg Subcutaneous Q24H   lidocaine  1 patch Transdermal Q24H   magnesium oxide  200 mg Oral Daily   multivitamin with minerals  1 tablet Oral Daily   omega-3 acid ethyl esters  1 g Oral Daily      ROS:                                                                                                                                       As per HPI.    Blood pressure 136/60, pulse 60, temperature 98.4 F (36.9 C), temperature source Oral, resp. rate 20, height 5\' 5"  (1.651 m), weight 61.2 kg, SpO2 94 %.   General Examination:                                                                                                       Physical Exam  HEENT-  [place]/AT    Lungs- Respirations unlabored Extremities- No edema   Neurological  Examination Mental Status: Awake, alert and oriented. Speech fluent without evidence of aphasia.  Able to follow all commands without difficulty. Cranial Nerves: II: Temporal visual fields intact with no extinction to DSS. PERRL  III,IV, VI: No ptosis. EOMI.  V: Temp sensation equal bilaterally  VII: Smile symmetric VIII: Hearing intact to voice IX,X: No hoarseness XI: Symmetric shoulder shrug XII: Midline tongue extension Motor: BUE 5/5 proximally and distally BLE 5/5 proximally and distally  No pronator drift.  Sensory: Temp and light touch intact throughout, bilaterally. No extinction to DSS.  Deep Tendon Reflexes: 2+ and symmetric throughout Cerebellar: No ataxia with FNF bilaterally  Gait: Deferred   Lab Results: Basic Metabolic Panel: Recent Labs  Lab 11/02/21 1925 11/03/21 0952  NA 133* 139  K 3.6 4.1  CL 100 104  CO2 26 27  GLUCOSE 106* 99  BUN 25* 22  CREATININE 0.92 0.87  CALCIUM 10.0 9.6    CBC: Recent Labs  Lab 11/02/21 1925 11/03/21 0952  WBC 6.7 6.4  NEUTROABS  --  4.2  HGB 11.9* 12.1  HCT 36.1 36.7  MCV 91.2 91.3  PLT 288 293    Cardiac Enzymes: No results for input(s): "CKTOTAL", "CKMB", "CKMBINDEX", "TROPONINI" in the last 168 hours.  Lipid Panel: Recent Labs  Lab 11/04/21 0327  CHOL 204*  TRIG 152*  HDL 55  CHOLHDL 3.7  VLDL 30  LDLCALC 397*    Imaging: CT HEAD WO CONTRAST ( )  Result Date: 11/03/2021 CLINICAL DATA:  Vertigo, central EXAM: CT HEAD WITHOUT CONTRAST TECHNIQUE: Contiguous axial images were obtained from the base of the skull through the vertex without intravenous contrast. RADIATION DOSE REDUCTION: This exam was performed according to the departmental dose-optimization program which includes automated exposure control, adjustment of the mA and/or kV according to patient size and/or use of iterative reconstruction technique. COMPARISON:  None Available. FINDINGS: Brain: No evidence of large-territorial acute  infarction. No parenchymal hemorrhage. No mass lesion. No extra-axial collection. No mass effect or midline shift. No hydrocephalus. Basilar cisterns are patent. Vascular: No hyperdense vessel. Skull: No acute fracture or focal lesion. Sinuses/Orbits: Paranasal sinuses and mastoid air cells are clear. The orbits are unremarkable. Other: None. IMPRESSION: No acute intracranial abnormality. Electronically Signed   By: Tish Frederickson M.D.   On: 11/03/2021 20:15   DG HIP UNILAT WITH PELVIS 2-3 VIEWS RIGHT  Result Date: 11/03/2021 CLINICAL DATA:  [id number] EXAM: DG HIP (WITH OR WITHOUT PELVIS) 2-3V RIGHT COMPARISON:  None Available. FINDINGS: There is no evidence of hip fracture or dislocation of the right hip. No acute displaced fracture or dislocation of the left hip on frontal view. 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 No acute displaced fracture or diastasis of the bones of the pelvis. IMPRESSION: 1. No acute displaced fracture or dislocation of the right hip. 2. 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 Electronically Signed   By: Tish Frederickson M.D.   On: 11/03/2021 20:13   DG Chest 2 View  Result Date: 11/03/2021 CLINICAL DATA:  71 year old female with cough and dizziness. EXAM: CHEST - 2 VIEW COMPARISON:  Chest radiographs 06/05/2021. FINDINGS: Large lung volumes. Stable left chest dual lead cardiac pacemaker. Normal cardiac size and mediastinal contours. Visualized tracheal air column is within normal limits. Both lungs appear clear. No pneumothorax or pleural effusion. Abdominal Calcified aortic atherosclerosis. No acute osseous abnormality identified. Negative visible bowel gas. IMPRESSION: Evidence of chronic hyperinflation. No acute cardiopulmonary abnormality. Electronically Signed   By: Odessa Fleming M.D.   On: 11/03/2021 09:52     Assessment: 71 year old female with shooting bilateral deep thigh pain and proximal left leg pain below the knee on the left precipitated by ambulation and  relieved with rest 1. Neurological exam is normal.  2. CT head with no acute intracranial abnormality.  3. Her BLE deep "bone pain" symptoms precipitated by exercise are not consistent with a neurological origin. Consider muscle pain precipitated by exercise versus  referred hip pain given the severe right hip degenerative changes seen on x-ray.   Recommendations: 1. Xray of left hip.  2. Can cancel MRI brain. No indication for brain imaging given negative neurological exam and symptoms that are not consistent with TIA.  3. Please call the Neurology service if there are additional questions.    Electronically signed: Dr. Caryl Pina 11/04/2021, 9:39 AM

## 2021-11-04 NOTE — Evaluation (Signed)
Occupational Therapy Evaluation Patient Details Name: Mallory Adams MRN: [id number] DOB: [birth year] Today's Date: 11/04/2021   History of Present Illness Pt is a 71 y/o F presenting to ED on 6/16 with dizziness and R leg pain, workup ongoing. PMH includes bradycardia with pacemaker implant, ACL rupture of R knee, HTN, chronic back pain, sciatica, and cervical spine stenosis.   Clinical Impression   Pt independent at baseline with ADLs and functional mobility, lives with spouse who can provide PRN assist but frequently travels for work. Pt currently reporting she has BLE pain multiple times a day, causing dizziness and nausea. Pt currently supervision-mod I for ADLs, mod I for bed mobility, and supervision for transfers. Pt expresses difficulty  with reaching/picking things up off the ground, educated on use of reacher for home. Pt verbalizes understanding. Pt presenting with impairments listed below, will follow acutely. Recommend d/c home with family assistance.     Recommendations for follow up therapy are one component of a multi-disciplinary discharge planning process, led by the attending physician.  Recommendations may be updated based on patient status, additional functional criteria and insurance authorization.   Follow Up Recommendations  No OT follow up    Assistance Recommended at Discharge PRN  Patient can return home with the following A little help with walking and/or transfers;A little help with bathing/dressing/bathroom;Assistance with cooking/housework    Functional Status Assessment  Patient has had a recent decline in their functional status and demonstrates the ability to make significant improvements in function in a reasonable and predictable amount of time.  Equipment Recommendations  None recommended by OT    Recommendations for Other Services PT consult     Precautions / Restrictions Precautions Precautions: Fall Restrictions Weight Bearing  Restrictions: No      Mobility Bed Mobility Overal bed mobility: Modified Independent             General bed mobility comments: no use of bed rail    Transfers Overall transfer level: Needs assistance Equipment used: None Transfers: Sit to/from Stand Sit to Stand: Supervision                  Balance Overall balance assessment: Mild deficits observed, not formally tested                                         ADL either performed or assessed with clinical judgement   ADL Overall ADL's : Needs assistance/impaired Eating/Feeding: Modified independent;Sitting   Grooming: Modified independent;Sitting Grooming Details (indicate cue type and reason): washing hands/brushing teeth at sink Upper Body Bathing: Supervision/ safety;Sitting   Lower Body Bathing: Supervison/ safety;Sitting/lateral leans   Upper Body Dressing : Supervision/safety;Standing Upper Body Dressing Details (indicate cue type and reason): don gown as jacket Lower Body Dressing: Supervision/safety;Sitting/lateral leans   Toilet Transfer: Modified Independent;Ambulation;Regular Social worker and Hygiene: Modified independent;Sitting/lateral lean       Functional mobility during ADLs: Supervision/safety       Vision Baseline Vision/History: 0 No visual deficits Patient Visual Report: No change from baseline Vision Assessment?: No apparent visual deficits     Perception     Praxis      Pertinent Vitals/Pain Pain Assessment Pain Assessment: Faces Pain Score: 4  Faces Pain Scale: Hurts little more Pain Location: L hip - sciatica pain Pain Descriptors / Indicators: Discomfort Pain Intervention(s): Limited activity within patient's tolerance,  Monitored during session, Repositioned     Hand Dominance     Extremity/Trunk Assessment Upper Extremity Assessment Upper Extremity Assessment: Overall WFL for tasks assessed   Lower Extremity  Assessment Lower Extremity Assessment: Defer to PT evaluation   Cervical / Trunk Assessment Cervical / Trunk Assessment: Normal   Communication Communication Communication: No difficulties   Cognition Arousal/Alertness: Awake/alert Behavior During Therapy: WFL for tasks assessed/performed Overall Cognitive Status: Within Functional Limits for tasks assessed                                 General Comments: A & O x4 scoring 2 on SBT indicative of normal cognition     General Comments  VSS on RA    Exercises     Shoulder Instructions      Home Living Family/patient expects to be discharged to:: Private residence Living Arrangements: Spouse/significant other Available Help at Discharge: Available PRN/intermittently (spouse travels for work) Type of Home: Apartment Home Access: Level entry     Home Layout: One level     Bathroom Shower/Tub: Producer, television/film/video: Standard     Home Equipment: None          Prior Functioning/Environment Prior Level of Function : Independent/Modified Independent;Driving             Mobility Comments: no AD use ADLs Comments: ind with IADLs        OT Problem List: Decreased strength;Decreased range of motion;Decreased activity tolerance;Impaired balance (sitting and/or standing)      OT Treatment/Interventions: Self-care/ADL training;Therapeutic exercise;Therapeutic activities;Patient/family education;Balance training    OT Goals(Current goals can be found in the care plan section) Acute Rehab OT Goals Patient Stated Goal: to go home OT Goal Formulation: With patient Time For Goal Achievement: 11/18/21 Potential to Achieve Goals: Good ADL Goals Pt Will Perform Tub/Shower Transfer: ambulating;Shower transfer;Independently Additional ADL Goal #1: Pt will complete 4 step trailmaking task accurately in prep for ADLs  OT Frequency: Min 2X/week    Co-evaluation              AM-PAC OT "6  Clicks" Daily Activity     Outcome Measure Help from another person eating meals?: None Help from another person taking care of personal grooming?: None Help from another person toileting, which includes using toliet, bedpan, or urinal?: None Help from another person bathing (including washing, rinsing, drying)?: A Little Help from another person to put on and taking off regular upper body clothing?: None Help from another person to put on and taking off regular lower body clothing?: A Little 6 Click Score: 22   End of Session Nurse Communication: Mobility status  Activity Tolerance: Patient tolerated treatment well Patient left: in bed;with call bell/phone within reach;with bed alarm set  OT Visit Diagnosis: Unsteadiness on feet (R26.81);Other abnormalities of gait and mobility (R26.89);Muscle weakness (generalized) (M62.81)                Time: 1655-3748 OT Time Calculation (min): 25 min Charges:  OT General Charges $OT Visit: 1 Visit OT Evaluation $OT Eval Low Complexity: 1 Low OT Treatments $Self Care/Home Management : 8-22 mins  Alfonzo Beers, OTD, OTR/L Acute Rehab [phone]   Mayer Masker 11/04/2021, 9:54 AM

## 2021-11-04 NOTE — Plan of Care (Signed)
  Problem: Education: Goal: Knowledge of disease or condition will improve Outcome: Progressing Goal: Knowledge of secondary prevention will improve (SELECT ALL) Outcome: Progressing Goal: Knowledge of patient specific risk factors will improve (INDIVIDUALIZE FOR PATIENT) Outcome: Progressing Goal: Individualized Educational Video(s) Outcome: Progressing   Problem: Education: Goal: Knowledge of General Education information will improve Description: Including pain rating scale, medication(s)/side effects and non-pharmacologic comfort measures Outcome: Progressing   Problem: Health Behavior/Discharge Planning: Goal: Ability to manage health-related needs will improve Outcome: Progressing   Problem: Clinical Measurements: Goal: Ability to maintain clinical measurements within normal limits will improve Outcome: Progressing Goal: Will remain free from infection Outcome: Progressing Goal: Diagnostic test results will improve Outcome: Progressing Goal: Respiratory complications will improve Outcome: Progressing Goal: Cardiovascular complication will be avoided Outcome: Progressing   Problem: Activity: Goal: Risk for activity intolerance will decrease Outcome: Progressing   Problem: Nutrition: Goal: Adequate nutrition will be maintained Outcome: Progressing   Problem: Coping: Goal: Level of anxiety will decrease Outcome: Progressing   Problem: Elimination: Goal: Will not experience complications related to bowel motility Outcome: Progressing Goal: Will not experience complications related to urinary retention Outcome: Progressing   Problem: Pain Managment: Goal: General experience of comfort will improve Outcome: Progressing   Problem: Safety: Goal: Ability to remain free from injury will improve Outcome: Progressing   Problem: Skin Integrity: Goal: Risk for impaired skin integrity will decrease Outcome: Progressing   

## 2021-11-04 NOTE — Evaluation (Signed)
Physical Therapy Evaluation & Discharge Patient Details Name: Mallory Adams MRN: [id number] DOB: 07-27-1950 Today's Date: 11/04/2021  History of Present Illness  71 y/o female presented to Ed on 11/02/21 for R sided leg pain, dizziness, and burning when urinating. UA negative for UTI. CT head negative. PMH: HTN, bradycardia s/p pacemaker  Clinical Impression  Patient admitted with the above. Patient is independent at baseline and lives with husband. Patient currently functioning at modI level for mobility with no AD. Limited by sciatica pain in L hip during mobility but no overt LOB. Instructed patient on use of tennis ball against the wall for therapeutic massage of piriformis and glutes, patient verbalized understanding. Patient unable to attend OPPT due to only having 1 car and husband travels 1 hour for work each day. No further skilled PT needs identified acutely. No PT follow up recommended at this time.        Recommendations for follow up therapy are one component of a multi-disciplinary discharge planning process, led by the attending physician.  Recommendations may be updated based on patient status, additional functional criteria and insurance authorization.  Follow Up Recommendations No PT follow up    Assistance Recommended at Discharge PRN  Patient can return home with the following       Equipment Recommendations None recommended by PT  Recommendations for Other Services       Functional Status Assessment Patient has not had a recent decline in their functional status     Precautions / Restrictions Precautions Precautions: Fall Restrictions Weight Bearing Restrictions: No      Mobility  Bed Mobility Overal bed mobility: Modified Independent                  Transfers Overall transfer level: Modified independent Equipment used: None                    Ambulation/Gait Ambulation/Gait assistance: Modified independent (Device/Increase  time) Gait Distance (Feet): 300 Feet Assistive device: None Gait Pattern/deviations: Step-through pattern, Decreased stride length, Decreased stance time - left Gait velocity: decreased Gait velocity interpretation: >2.62 ft/sec, indicative of community ambulatory   General Gait Details: overall modI. decreased stance time on L with increasing pain  Stairs            Wheelchair Mobility    Modified Rankin (Stroke Patients Only)       Balance Overall balance assessment: Mild deficits observed, not formally tested                               Standardized Balance Assessment Standardized Balance Assessment : Dynamic Gait Index   Dynamic Gait Index Level Surface: Normal Change in Gait Speed: Normal Gait with Horizontal Head Turns: Normal Gait with Vertical Head Turns: Normal Gait and Pivot Turn: Normal Step Over Obstacle: Normal Step Around Obstacles: Normal Steps: Mild Impairment Total Score: 23       Pertinent Vitals/Pain Pain Assessment Pain Assessment: Faces Faces Pain Scale: Hurts little more Pain Location: L hip - sciatica pain Pain Descriptors / Indicators: Discomfort Pain Intervention(s): Monitored during session, Repositioned    Home Living Family/patient expects to be discharged to:: Private residence Living Arrangements: Spouse/significant other Available Help at Discharge: Available PRN/intermittently (spouse travels for work) Type of Home: Apartment Home Access: Level entry       Home Layout: One level Home Equipment: None      Prior Function Prior Level of  Function : Independent/Modified Independent;Driving             Mobility Comments: no AD use ADLs Comments: ind with IADLs     Hand Dominance        Extremity/Trunk Assessment   Upper Extremity Assessment Upper Extremity Assessment: Defer to OT evaluation    Lower Extremity Assessment Lower Extremity Assessment: Overall WFL for tasks assessed (L hip pain  from sciatica)    Cervical / Trunk Assessment Cervical / Trunk Assessment: Normal  Communication   Communication: No difficulties  Cognition Arousal/Alertness: Awake/alert Behavior During Therapy: WFL for tasks assessed/performed Overall Cognitive Status: Within Functional Limits for tasks assessed                                          General Comments      Exercises Other Exercises Other Exercises: instucted patient on use of tennis ball for therapeutic massage of piriformis and glutes   Assessment/Plan    PT Assessment Patient does not need any further PT services  PT Problem List         PT Treatment Interventions      PT Goals (Current goals can be found in the Care Plan section)  Acute Rehab PT Goals Patient Stated Goal: did not specifically state PT Goal Formulation: All assessment and education complete, DC therapy    Frequency       Co-evaluation               AM-PAC PT "6 Clicks" Mobility  Outcome Measure Help needed turning from your back to your side while in a flat bed without using bedrails?: None Help needed moving from lying on your back to sitting on the side of a flat bed without using bedrails?: None Help needed moving to and from a bed to a chair (including a wheelchair)?: None Help needed standing up from a chair using your arms (e.g., wheelchair or bedside chair)?: None Help needed to walk in hospital room?: None Help needed climbing 3-5 steps with a railing? : A Little 6 Click Score: 23    End of Session   Activity Tolerance: Patient tolerated treatment well Patient left: in bed;with call bell/phone within reach Nurse Communication: Mobility status PT Visit Diagnosis: Muscle weakness (generalized) (M62.81)    Time: 5956-3875 PT Time Calculation (min) (ACUTE ONLY): 21 min   Charges:   PT Evaluation $PT Eval Low Complexity: 1 Low          Akire Rennert A. Dan Humphreys PT, DPT Acute Rehabilitation Services Office  [phone]   Viviann Spare 11/04/2021, 11:53 AM

## 2021-11-04 NOTE — Plan of Care (Signed)
Problem: Education: Goal: Knowledge of disease or condition will improve 11/04/2021 0814 by Barnabas Harries, RN Outcome: Progressing 11/04/2021 0810 by Barnabas Harries, RN Outcome: Progressing Goal: Knowledge of secondary prevention will improve (SELECT ALL) 11/04/2021 0814 by Barnabas Harries, RN Outcome: Progressing 11/04/2021 0810 by Barnabas Harries, RN Outcome: Progressing Goal: Knowledge of patient specific risk factors will improve (INDIVIDUALIZE FOR PATIENT) 11/04/2021 0814 by Barnabas Harries, RN Outcome: Progressing 11/04/2021 0810 by Barnabas Harries, RN Outcome: Progressing Goal: Individualized Educational Video(s) 11/04/2021 0814 by Barnabas Harries, RN Outcome: Progressing 11/04/2021 0810 by Barnabas Harries, RN Outcome: Progressing   Problem: Education: Goal: Knowledge of General Education information will improve Description: Including pain rating scale, medication(s)/side effects and non-pharmacologic comfort measures 11/04/2021 0814 by Barnabas Harries, RN Outcome: Progressing 11/04/2021 0810 by Barnabas Harries, RN Outcome: Progressing   Problem: Health Behavior/Discharge Planning: Goal: Ability to manage health-related needs will improve 11/04/2021 0814 by Barnabas Harries, RN Outcome: Progressing 11/04/2021 0810 by Barnabas Harries, RN Outcome: Progressing   Problem: Clinical Measurements: Goal: Ability to maintain clinical measurements within normal limits will improve 11/04/2021 0814 by Barnabas Harries, RN Outcome: Progressing 11/04/2021 0810 by Barnabas Harries, RN Outcome: Progressing Goal: Will remain free from infection 11/04/2021 0814 by Barnabas Harries, RN Outcome: Progressing 11/04/2021 0810 by Barnabas Harries, RN Outcome: Progressing Goal: Diagnostic test results will improve 11/04/2021 0814 by Barnabas Harries, RN Outcome: Progressing 11/04/2021 0810 by Barnabas Harries, RN Outcome: Progressing Goal: Respiratory complications will improve 11/04/2021 0814 by Barnabas Harries,  RN Outcome: Progressing 11/04/2021 0810 by Barnabas Harries, RN Outcome: Progressing Goal: Cardiovascular complication will be avoided 11/04/2021 0814 by Barnabas Harries, RN Outcome: Progressing 11/04/2021 0810 by Barnabas Harries, RN Outcome: Progressing   Problem: Activity: Goal: Risk for activity intolerance will decrease 11/04/2021 0814 by Barnabas Harries, RN Outcome: Progressing 11/04/2021 0810 by Barnabas Harries, RN Outcome: Progressing   Problem: Nutrition: Goal: Adequate nutrition will be maintained 11/04/2021 0814 by Barnabas Harries, RN Outcome: Progressing 11/04/2021 0810 by Barnabas Harries, RN Outcome: Progressing   Problem: Coping: Goal: Level of anxiety will decrease 11/04/2021 0814 by Barnabas Harries, RN Outcome: Progressing 11/04/2021 0810 by Barnabas Harries, RN Outcome: Progressing   Problem: Elimination: Goal: Will not experience complications related to bowel motility 11/04/2021 0814 by Barnabas Harries, RN Outcome: Progressing 11/04/2021 0810 by Barnabas Harries, RN Outcome: Progressing Goal: Will not experience complications related to urinary retention 11/04/2021 0814 by Barnabas Harries, RN Outcome: Progressing 11/04/2021 0810 by Barnabas Harries, RN Outcome: Progressing   Problem: Pain Managment: Goal: General experience of comfort will improve 11/04/2021 0814 by Barnabas Harries, RN Outcome: Progressing 11/04/2021 0810 by Barnabas Harries, RN Outcome: Progressing   Problem: Safety: Goal: Ability to remain free from injury will improve 11/04/2021 0814 by Barnabas Harries, RN Outcome: Progressing 11/04/2021 0810 by Barnabas Harries, RN Outcome: Progressing   Problem: Skin Integrity: Goal: Risk for impaired skin integrity will decrease 11/04/2021 0814 by Barnabas Harries, RN Outcome: Progressing 11/04/2021 0810 by Barnabas Harries, RN Outcome: Progressing   Problem: Education: Goal: Knowledge of disease or condition will improve Outcome: Progressing Goal: Knowledge of secondary  prevention will improve (SELECT ALL) Outcome: Progressing Goal: Knowledge of patient specific risk factors will improve (INDIVIDUALIZE FOR PATIENT) Outcome: Progressing   Problem: Coping: Goal: Will verbalize positive feelings about self Outcome: Progressing Goal: Will  identify appropriate support needs Outcome: Progressing   Problem: Health Behavior/Discharge Planning: Goal: Ability to manage health-related needs will improve Outcome: Progressing   Problem: Self-Care: Goal: Ability to participate in self-care as condition permits will improve Outcome: Progressing Goal: Verbalization of feelings and concerns over difficulty with self-care will improve Outcome: Progressing Goal: Ability to communicate needs accurately will improve Outcome: Progressing   Problem: Nutrition: Goal: Risk of aspiration will decrease Outcome: Progressing

## 2021-11-05 ENCOUNTER — Telehealth: Payer: Self-pay | Admitting: Family

## 2021-11-05 ENCOUNTER — Ambulatory Visit (INDEPENDENT_AMBULATORY_CARE_PROVIDER_SITE_OTHER): Payer: Self-pay

## 2021-11-05 DIAGNOSIS — R001 Bradycardia, unspecified: Secondary | ICD-10-CM

## 2021-11-05 NOTE — Telephone Encounter (Signed)
Patient went to ED on 6/17- patient will call back to sch a fu with stephanie -   Patient Name: Mallory Adams Gender: Female DOB: 11-Apr-1951 Age: 71 Y 6 M 28 D Return Phone Number: [phone] (Primary) Address: City/ State/ Zip: [place]  [address] Client [facility] - Administrator, [facility] Contact Type Call Who Is Calling Patient / Member / Family / Caregiver Call Type Triage / Clinical Caller Name Tammy P. Relationship To Patient Other Return Phone Number [phone] (Primary) Chief Complaint Leg Pain Reason for Call Symptomatic / Request for Health Information Initial Comment Caller states there is a patient on the line having symptoms. Hip pain, UTI, believes UTI symptoms are making her feel loopy. Legs and Knees hurt. Translation No Nurse Assessment Nurse: Tanna Furry, RN, Rolly Salter Date/Time (Eastern Time): 11/02/2021 4:51:00 PM Confirm and document reason for call. If symptomatic, describe symptoms. ---Caller states that for the past 3 days she has been experiencing bilateral Legs and Knees and Hip pain (has sciatica on left side. ) hurt as well as a sore throat, UTI symptoms burning with urination, pain in bladder and cloudy urine is feeling dizzy starting today she has felt unsteady on her feet 98.5 last temperature Does the patient have any new or worsening symptoms? ---Yes Will a triage be completed? ---Yes Related visit to physician within the last 2 weeks? ---No Does the PT have any chronic conditions? (i.e. diabetes, asthma, this includes High risk factors for pregnancy, etc.) ---Yes List chronic conditions. ---sciatica, internal pacemaker placed in [place] last October, due to bradycardia. set at 60 bpm Is this a behavioral health or substance abuse call? ---No Guidelines Guideline Title Affirmed Question Affirmed Notes Nurse Date/Time (Eastern Time) Dizziness  - Lightheadedness [1] MODERATE dizziness (e.g., interferes with normal activities) Tanna Furry, Johnna Acosta 11/02/2021 4:56:53 PM PLEASE NOTE: All timestamps contained within this report are represented as [place] Standard Time. CONFIDENTIALTY NOTICE: This fax transmission is intended only for the addressee. It contains information that is legally privileged, confidential or otherwise protected from use or disclosure. If you are not the intended recipient, you are strictly prohibited from reviewing, disclosing, copying using or disseminating any of this information or taking any action in reliance on or regarding this information. If you have received this fax in error, please notify us immediately by telephone so that we can arrange for its return to [place]. Phone: [phone], Toll-Free: [phone], Fax: [phone] Page: 2 of 2 Call Id: [id number] Guidelines Guideline Title Affirmed Question Affirmed Notes Nurse Date/Time Lamount Cohen Time) AND [2] has NOT been evaluated by doctor (or NP/PA) for this (Exception: Dizziness caused by heat exposure, sudden standing, or poor fluid intake.) Disp. Time Lamount Cohen Time) Disposition Final User 11/02/2021 4:12:11 PM Attempt made - message left Nils Pyle 11/02/2021 4:27:27 PM Attempt made - message left Nils Pyle 11/02/2021 5:04:45 PM See PCP within 24 Hours Yes Tanna Furry, RN, Mauri Brooklyn Disagree/Comply Comply Caller Understands Yes PreDisposition Go to Urgent Care/Walk-In Clinic Care Advice Given Per Guideline SEE PCP WITHIN 24 HOURS: * IF OFFICE WILL BE CLOSED: You need to be seen within the next 24 hours. A clinic or an urgent care center is often a good source of care if your doctor's office is closed or you can't get an appointment. LIE DOWN AND REST: * Lie down with feet elevated for 1 hour. CALL BACK IF: * Passes out (faints) * You become worse CARE ADVICE  given per Dizziness (Adult) guideline. * This will improve circulation and  increase blood flow to the brain.

## 2021-11-07 ENCOUNTER — Ambulatory Visit (INDEPENDENT_AMBULATORY_CARE_PROVIDER_SITE_OTHER): Payer: BC Managed Care – PPO | Admitting: Family

## 2021-11-07 ENCOUNTER — Encounter: Payer: Self-pay | Admitting: Family

## 2021-11-07 VITALS — BP 140/57 | HR 60 | Temp 97.9°F | Ht 65.0 in | Wt 135.2 lb

## 2021-11-07 DIAGNOSIS — I10 Essential (primary) hypertension: Secondary | ICD-10-CM | POA: Diagnosis not present

## 2021-11-07 DIAGNOSIS — R070 Pain in throat: Secondary | ICD-10-CM

## 2021-11-07 DIAGNOSIS — M5416 Radiculopathy, lumbar region: Secondary | ICD-10-CM

## 2021-11-07 MED ORDER — METHOCARBAMOL 500 MG PO TABS: 500.0000 mg | ORAL_TABLET | Freq: Four times a day (QID) | ORAL | 1 refills | Status: DC

## 2021-11-07 MED ORDER — PANTOPRAZOLE SODIUM 20 MG PO TBEC: 20.0000 mg | DELAYED_RELEASE_TABLET | Freq: Every day | ORAL | 0 refills | Status: DC

## 2021-11-07 MED ORDER — AMLODIPINE BESYLATE 5 MG PO TABS: 7.5000 mg | ORAL_TABLET | Freq: Every day | ORAL | 0 refills | Status: DC

## 2021-11-07 NOTE — Progress Notes (Signed)
Subjective:     Patient ID: Mallory Adams, female    DOB: 1950-07-12, 71 y.o.   MRN: [id number]  Chief Complaint  Patient presents with   Follow-up    Pt was seen in ED on 11/03/21 for dizziness, sore throat for the last 2 weeks, pain in both legs(sciatica) and frequent UTI. UA was negative at hospital.  Pt still has sore throat and horse, has tried tea. Left hip degeneration. Spinal stenosis.     HPI: Sore Throat: Patient complains of sore throat. Associated symptoms include hoarseness, post nasal drip, and sore throat.Onset of symptoms was  weeks ago, unchanged since that time. She is drinking moderate amounts of fluids. She has not had recent close exposure to someone with proven streptococcal pharyngitis.  Sciatica:  seen in ER a few days ago for tingling. pt has hx of chronic low back pain and sciatica down left leg, but this episode affected her right leg, denies paresthesias, reports burning pain, has not tried any OTC meds, pt reports sensitivity to many medications.  Hypertension: Patient is currently maintained on the following medications for blood pressure: Amlodipine Failed meds include: none Patient reports good compliance with blood pressure medications. Patient denies chest pain, headaches, shortness of breath or swelling. Last 3 blood pressure readings in our office are as follows: BP Readings from Last 3 Encounters:  11/07/21 (!) 140/57  11/04/21 (!) 144/69  10/05/21 (!) 189/69    Assessment & Plan:   Problem List Items Addressed This Visit       Cardiovascular and Mediastinum   Essential hypertension    Chronic - elevated in recent ER visit, pt reports white coat syndrome and having current sciatic pain causes her BP to rise. Advised pt to increase her Amlodipine to 7.5mg  qd for now until pain is under better control, monitor at home & let me know if still running >130/90- f/u in 74mos.      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 OK to take with generic Aleve bid. Sending referral to discuss options as pt has had this now in both legs for years, but does not want surgery, advised to discuss options.     Relevant Medications   methocarbamol (ROBAXIN) 500 MG tablet   Other Relevant Orders   Ambulatory referral to Neurosurgery   Other Visit Diagnoses     Throat pain in adult    - denies heartburn, c/o hoarseness, mild postnasal drip, pain with swallowing liquids or solids. Starting Protonix trial, advised on use & SE, call back if no improvement in sx.    Relevant Medications   pantoprazole (PROTONIX) 20 MG tablet      Outpatient Medications Prior to Visit  Medication Sig Dispense Refill   BLACK PEPPER-TURMERIC PO Take 1 tablet by mouth daily.     Cholecalciferol 25 MCG (1000 UT) capsule Take 1 capsule by mouth daily.     Magnesium 250 MG TABS Take 1 tablet by mouth daily.     Multiple Vitamins-Minerals (MULTIVITAMIN WOMEN 50+) TABS Take 1 tablet by mouth daily.     Omega-3 Fatty Acids (FISH OIL) 1000 MG CAPS Take 1 capsule by mouth daily.     amLODipine (NORVASC) 5 MG tablet Take 1 tablet (5 mg total) by mouth daily. 90 tablet 0  cephALEXin (KEFLEX) 500 MG capsule Take 1 capsule (500 mg total) by mouth 4 (four) times daily. 20 capsule 0   No facility-administered medications prior to visit.    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 2022    No past surgical history on file.  Allergies  Allergen Reactions   Cipro [Ciprofloxacin Hcl] Other (See Comments)    Unknown reaction   Gluten Meal     Other reaction(s):  Other Esophageal spasms and acid reflux, making it difficult to breathe   Influenza Virus Vaccine     Other reaction(s): Unknown       Objective:    Physical Exam Vitals and nursing note reviewed.  Constitutional:      Appearance: Normal appearance.  HENT:     Mouth/Throat:     Mouth: Mucous membranes are moist.     Pharynx: Posterior oropharyngeal erythema (mild) and uvula swelling present. No pharyngeal swelling or oropharyngeal exudate.  Cardiovascular:     Rate and Rhythm: Normal rate and regular rhythm.  Pulmonary:     Effort: Pulmonary effort is normal.     Breath sounds: Normal breath sounds.  Musculoskeletal:     Right hip: Tenderness present. Decreased range of motion.  Skin:    General: Skin is warm and dry.  Neurological:     Mental Status: She is alert.  Psychiatric:        Mood and Affect: Mood normal.        Behavior: Behavior normal.     BP (!) 140/57   Pulse 60   Temp 97.9 F (36.6 C) (Temporal)   Ht 5\' 5"  (1.651 m)   Wt 135 lb 4 oz (61.3 kg)   SpO2 100%   BMI 22.51 kg/m  Wt Readings from Last 3 Encounters:  11/07/21 135 lb 4 oz (61.3 kg)  11/03/21 135 lb (61.2 kg)  07/03/21 142 lb 3.2 oz (64.5 kg)       Meds ordered this encounter  Medications   methocarbamol (ROBAXIN) 500 MG tablet    Sig: Take 1-2 tablets (500-1,000 mg total) by mouth 4 (four) times daily.    Dispense:  60 tablet    Refill:  1    Order Specific Question:   Supervising Provider    Answer:   ANDY, CAMILLE L [[id number]]   pantoprazole (PROTONIX) 20 MG tablet    Sig: Take 1 tablet (20 mg total) by mouth daily. Take 1 pill twice a day for 1 week, then 1 pill every morning, call office if no improvement.    Dispense:  30 tablet    Refill:  0    Order Specific Question:   Supervising Provider    Answer:   ANDY, CAMILLE L [[id number]]   amLODipine (NORVASC) 5 MG tablet    Sig: Take 1.5 tablets (7.5 mg total) by mouth daily.    Dispense:  135 tablet    Refill:  0    Order Specific  Question:   Supervising Provider    Answer:   ANDY, CAMILLE L [[id number]]    07/05/21, NP

## 2021-11-07 NOTE — Assessment & Plan Note (Signed)
Chronic - elevated in recent ER visit, pt reports white coat syndrome and having current sciatic pain causes her BP to rise. Advised pt to increase her Amlodipine to 7.5mg  qd for now until pain is under better control, monitor at home & let me know if still running >130/90- f/u in 14mos.

## 2021-11-07 NOTE — Patient Instructions (Signed)
It was very nice to see you today!   I have sent over generic Robaxin to take for your back and leg pain as needed.  Start with 1 pill, if no drowsiness increase to 2 pills 4 times per day as needed.  I also sent a referral to our neurosurgery office, they will call you directly to schedule an appointment.  I have also sent over a trial of generic Protonix to see if your throat pain improves. Take 1 pill twice a day for 1 week, then 1 pill every morning, call office if no improvement.  Start taking 1.5 pills of your Amlodipine to help lower your blood pressure, especially now while you are having pain which can cause it to increase.  Drink at least 2 liters of water daily to meet your hydration requirements daily!    PLEASE NOTE:  If you had any lab tests please let us know if you have not heard back within a few days. You may see your results on MyChart before we have a chance to review them but we will give you a call once they are reviewed by [place]. If we ordered any referrals today, please let us know if you have not heard from their office within the next week.

## 2021-11-08 LAB — CUP PACEART REMOTE DEVICE CHECK
Battery Remaining Longevity: 181 mo
Battery Voltage: 3.11 V
Brady Statistic AP VP Percent: 0.03 %
Brady Statistic AP VS Percent: 48.47 %
Brady Statistic AS VP Percent: 0 %
Brady Statistic AS VS Percent: 51.5 %
Brady Statistic RA Percent Paced: 48.56 %
Brady Statistic RV Percent Paced: 0.03 %
Date Time Interrogation Session: 20230617132654
Implantable Lead Implant Date: 20221013
Implantable Lead Implant Date: 20221013
Implantable Lead Location: 753859
Implantable Lead Location: 753860
Implantable Lead Model: 3830
Implantable Lead Model: 5076
Implantable Pulse Generator Implant Date: 20221013
Lead Channel Impedance Value: 361 Ohm
Lead Channel Impedance Value: 418 Ohm
Lead Channel Impedance Value: 456 Ohm
Lead Channel Impedance Value: 798 Ohm
Lead Channel Pacing Threshold Amplitude: 0.625 V
Lead Channel Pacing Threshold Amplitude: 1.125 V
Lead Channel Pacing Threshold Pulse Width: 0.4 ms
Lead Channel Pacing Threshold Pulse Width: 0.4 ms
Lead Channel Sensing Intrinsic Amplitude: 3.75 mV
Lead Channel Sensing Intrinsic Amplitude: 3.75 mV
Lead Channel Sensing Intrinsic Amplitude: 4.125 mV
Lead Channel Sensing Intrinsic Amplitude: 4.125 mV
Lead Channel Setting Pacing Amplitude: 1.5 V
Lead Channel Setting Pacing Amplitude: 2.25 V
Lead Channel Setting Pacing Pulse Width: 0.4 ms
Lead Channel Setting Sensing Sensitivity: 0.9 mV

## 2021-11-14 ENCOUNTER — Encounter: Payer: Self-pay | Admitting: Family

## 2021-11-25 ENCOUNTER — Encounter: Payer: Self-pay | Admitting: Family

## 2021-11-26 NOTE — Progress Notes (Signed)
Remote pacemaker transmission.   

## 2021-11-27 ENCOUNTER — Other Ambulatory Visit: Payer: Self-pay | Admitting: Family

## 2021-11-27 DIAGNOSIS — R070 Pain in throat: Secondary | ICD-10-CM

## 2021-11-28 ENCOUNTER — Encounter: Payer: Self-pay | Admitting: Family

## 2021-11-28 DIAGNOSIS — R490 Dysphonia: Secondary | ICD-10-CM

## 2021-11-28 DIAGNOSIS — Z20818 Contact with and (suspected) exposure to other bacterial communicable diseases: Secondary | ICD-10-CM

## 2021-11-29 ENCOUNTER — Other Ambulatory Visit: Payer: Self-pay | Admitting: Family

## 2021-11-29 ENCOUNTER — Ambulatory Visit: Payer: BC Managed Care – PPO | Admitting: Family

## 2021-11-29 ENCOUNTER — Ambulatory Visit (INDEPENDENT_AMBULATORY_CARE_PROVIDER_SITE_OTHER): Payer: BC Managed Care – PPO | Admitting: Family

## 2021-11-29 VITALS — BP 130/59 | HR 62 | Temp 98.2°F | Ht 65.0 in | Wt 136.1 lb

## 2021-11-29 DIAGNOSIS — J029 Acute pharyngitis, unspecified: Secondary | ICD-10-CM

## 2021-11-29 LAB — POCT RAPID STREP A (OFFICE): Rapid Strep A Screen: POSITIVE — AB

## 2021-11-29 MED ORDER — AMOXICILLIN 500 MG PO CAPS: 500.0000 mg | ORAL_CAPSULE | Freq: Two times a day (BID) | ORAL | 0 refills | Status: AC

## 2021-11-29 NOTE — Patient Instructions (Addendum)
It was very nice to see you today!   You are positive for strep throat and I have sent over Amoxicillin to take for 10 days.  OK to take Tylenol 1,000mg  or Ibuprofen 600mg  3 times per day for sore throat pain, swelling, and fever. Gargle with warm salt water several times per day.  OK to use over the counter Chloraseptic spray and/or throat lozenges as needed.  Drink plenty of water!       PLEASE NOTE:  If you had any lab tests please let know if you have not heard back within a few days. You may see your results on MyChart before we have a chance to review them but we will give you a call once they are reviewed by [place]. If we ordered any referrals today, please let us know if you have not heard from their office within the next week.

## 2021-11-29 NOTE — Progress Notes (Signed)
Patient ID: Mallory Adams, female    DOB: 07-12-1950, 71 y.o.   MRN: [id number]  Chief Complaint  Patient presents with   Sore Throat    Pt c/o sore throat and body aches for about a month. Pt states temp was a little high. Has tried ibuprofen and dual action which does help with the pain. Pt has not had a strep test.     HPI: Sore Throat: Patient complains of sore throat. Associated symptoms include sore throat, with tiredness, but no other sx. Onset of symptoms was 1 month ago, unchanged since that time. She is drinking moderate amounts of fluids. She has not had recent close exposure to someone with proven streptococcal pharyngitis.    Assessment & Plan:  1. Sore throat in ER for dizziness and leg pain about 3 weeks ago, nothing DX, had sore throat at that time also, but was negative for Strep. Positive today and sending AMOX advised on use & SE. OK to take Tylenol or Advil prn for pain/fever.  - POCT rapid strep A   Subjective:    Outpatient Medications Prior to Visit  Medication Sig Dispense Refill   amLODipine (NORVASC) 5 MG tablet Take 1.5 tablets (7.5 mg total) by mouth daily. 135 tablet 0   BLACK PEPPER-TURMERIC PO Take 1 tablet by mouth daily.     Cholecalciferol 25 MCG (1000 UT) capsule Take 1 capsule by mouth daily.     Magnesium 250 MG TABS Take 1 tablet by mouth daily.     methocarbamol (ROBAXIN) 500 MG tablet Take 1-2 tablets (500-1,000 mg total) by mouth 4 (four) times daily. 60 tablet 1   Multiple Vitamins-Minerals (MULTIVITAMIN WOMEN 50+) TABS Take 1 tablet by mouth daily.     Omega-3 Fatty Acids (FISH OIL) 1000 MG CAPS Take 1 capsule by mouth daily.     pantoprazole (PROTONIX) 20 MG tablet TAKE 1 PILL TWICE A DAY FOR 1 WEEK, THEN 1 PILL EVERY MORNING, CALL OFFICE IF NO IMPROVEMENT. 30 tablet 0   No facility-administered medications prior to visit.   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 2022   No past surgical history on file. Allergies  Allergen Reactions   Cipro [Ciprofloxacin Hcl] Other (See Comments)    Unknown reaction   Gluten Meal     Other reaction(s): Other Esophageal spasms and acid reflux, making it difficult to breathe   Influenza Virus Vaccine     Other reaction(s): Unknown      Objective:    Physical Exam Vitals and nursing note reviewed.  Constitutional:      Appearance: Normal appearance.  HENT:     Mouth/Throat:     Mouth: Mucous membranes are moist.     Pharynx: Posterior oropharyngeal erythema (mild) and uvula swelling present. No pharyngeal swelling or oropharyngeal exudate.  Cardiovascular:     Rate and Rhythm: Normal rate and regular rhythm.  Pulmonary:     Effort: Pulmonary effort is normal.     Breath sounds: Normal breath sounds.  Musculoskeletal:        General: Normal range of motion.  Skin:    General: Skin is warm and dry.  Neurological:     Mental Status: She is  alert.  Psychiatric:        Mood and Affect: Mood normal.        Behavior: Behavior normal.   BP (!) 130/59 (BP Location: Left Arm, Patient Position: Sitting, Cuff Size: Large)   Pulse 62   Temp 98.2 F (36.8 C) (Temporal)   Ht 5\' 5"  (1.651 m)   Wt 136 lb 2 oz (61.7 kg)   SpO2 99%   BMI 22.65 kg/m  Wt Readings from Last 3 Encounters:  11/29/21 136 lb 2 oz (61.7 kg)  11/07/21 135 lb 4 oz (61.3 kg)  11/03/21 135 lb (61.2 kg)       11/05/21, NP

## 2021-12-03 ENCOUNTER — Other Ambulatory Visit: Payer: Self-pay | Admitting: Neurosurgery

## 2021-12-03 ENCOUNTER — Other Ambulatory Visit (HOSPITAL_COMMUNITY): Payer: Self-pay | Admitting: Neurosurgery

## 2021-12-03 DIAGNOSIS — M48062 Spinal stenosis, lumbar region with neurogenic claudication: Secondary | ICD-10-CM

## 2021-12-13 ENCOUNTER — Other Ambulatory Visit: Payer: Self-pay

## 2021-12-13 DIAGNOSIS — I10 Essential (primary) hypertension: Secondary | ICD-10-CM

## 2021-12-13 MED ORDER — AMLODIPINE BESYLATE 5 MG PO TABS: 7.5000 mg | ORAL_TABLET | Freq: Every day | ORAL | 0 refills | Status: DC

## 2021-12-17 ENCOUNTER — Other Ambulatory Visit: Payer: Self-pay | Admitting: Family

## 2021-12-17 ENCOUNTER — Other Ambulatory Visit: Payer: BC Managed Care – PPO

## 2021-12-17 DIAGNOSIS — Z20818 Contact with and (suspected) exposure to other bacterial communicable diseases: Secondary | ICD-10-CM

## 2021-12-21 ENCOUNTER — Other Ambulatory Visit: Payer: Self-pay | Admitting: Family

## 2021-12-21 DIAGNOSIS — R070 Pain in throat: Secondary | ICD-10-CM

## 2021-12-24 NOTE — Progress Notes (Signed)
Your blood cultures have come back negative for any Listeria or other bacteria.  Take care!

## 2021-12-28 LAB — CULTURE, BLOOD (SINGLE)

## 2021-12-28 LAB — SPECIMEN STATUS REPORT

## 2021-12-31 ENCOUNTER — Ambulatory Visit: Payer: Managed Care, Other (non HMO) | Admitting: Family

## 2022-01-03 ENCOUNTER — Ambulatory Visit (INDEPENDENT_AMBULATORY_CARE_PROVIDER_SITE_OTHER): Payer: BC Managed Care – PPO | Admitting: Family

## 2022-01-03 ENCOUNTER — Encounter: Payer: Self-pay | Admitting: Family

## 2022-01-03 VITALS — BP 159/62 | HR 59 | Temp 97.6°F | Ht 65.0 in | Wt 134.8 lb

## 2022-01-03 DIAGNOSIS — J020 Streptococcal pharyngitis: Secondary | ICD-10-CM | POA: Diagnosis not present

## 2022-01-03 DIAGNOSIS — I10 Essential (primary) hypertension: Secondary | ICD-10-CM

## 2022-01-03 LAB — POCT RAPID STREP A (OFFICE): Rapid Strep A Screen: POSITIVE — AB

## 2022-01-03 MED ORDER — AMOXICILLIN 500 MG PO CAPS: 500.0000 mg | ORAL_CAPSULE | Freq: Two times a day (BID) | ORAL | 0 refills | Status: AC

## 2022-01-03 MED ORDER — AMLODIPINE BESYLATE 5 MG PO TABS: 7.5000 mg | ORAL_TABLET | Freq: Every day | ORAL | 0 refills | Status: AC

## 2022-01-03 NOTE — Progress Notes (Signed)
Patient ID: Mallory Adams, female    DOB: [birth year], 71 y.o.   MRN: [id number]  Chief Complaint  Patient presents with   Follow-up    Medication refill   Sore Throat    Pt states she is still having a sore throat  from 7/13 off and on for about 3 months. Pt was also diagnosed with strep at the time. Pt states it hurts more on her left side than her right. Has tried fresh ginger/lemon tea which does not help much.     HPI: Hypertension: Patient is currently maintained on the following medications for blood pressure: Amlodipine qd Patient reports good compliance with blood pressure medications. Patient denies chest pain, headaches, shortness of breath or swelling. Last 3 blood pressure readings in our office are as follows: BP Readings from Last 3 Encounters:  01/03/22 (!) 159/62  11/29/21 (!) 130/59  11/07/21 (!) 140/57   Sore Throat: Patient complains of continued sore throat. Associated symptoms include sore throat, with tiredness, but no other sx. Onset of symptoms was 3 months ago, postnasal drip & reflux tx did not help - came in to office & positive for Strep, treated, felt better, but reports sx never completely resolving. ENT referral sent at that time, but pt reports never receiving a call.  She has not had recent close exposure to someone with proven streptococcal pharyngitis.  Assessment & Plan:   Problem List Items Addressed This Visit       Cardiovascular and Mediastinum   Essential hypertension - Primary    Chronic taking Amlodipine 7.5mg  qd BP still a little high today, but pt reports running no higher than 130s/90 refilling med today planning to move out of town next month, f/u 3 mos if not moved      Relevant Medications   amLODipine (NORVASC) 5 MG tablet   Other Visit Diagnoses     Strep sore throat    - 2nd time in less than 2 months - atypical presentation, asymptomatic other than throat pain - she has had ongoing sore throat for over 3 mos,  pt advised to contact ENT referral, moving next month so unlikely she will be seen, advised to let me know if referral needed in her new location. Sending AMOX again.    Relevant Medications   amoxicillin (AMOXIL) 500 MG capsule   Other Relevant Orders   POCT rapid strep A (Completed)      Subjective:    Outpatient Medications Prior to Visit  Medication Sig Dispense Refill   BLACK PEPPER-TURMERIC PO Take 1 tablet by mouth daily.     Cholecalciferol 25 MCG (1000 UT) capsule Take 1 capsule by mouth daily.     Magnesium 250 MG TABS Take 1 tablet by mouth daily.     Multiple Vitamins-Minerals (MULTIVITAMIN WOMEN 50+) TABS Take 1 tablet by mouth daily.     Omega-3 Fatty Acids (FISH OIL) 1000 MG CAPS Take 1 capsule by mouth daily.     amLODipine (NORVASC) 5 MG tablet Take 1.5 tablets (7.5 mg total) by mouth daily. 135 tablet 0   methocarbamol (ROBAXIN) 500 MG tablet Take 1-2 tablets (500-1,000 mg total) by mouth 4 (four) times daily. (Patient not taking: Reported on 01/03/2022) 60 tablet 1   pantoprazole (PROTONIX) 20 MG tablet TAKE 1 PILL TWICE A DAY FOR 1 WEEK, THEN 1 PILL EVERY MORNING, CALL OFFICE IF NO IMPROVEMENT. (Patient not taking: Reported on 01/03/2022) 30 tablet 0   No facility-administered medications prior to visit.  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 2022   No past surgical history on file. Allergies  Allergen Reactions   Cipro [Ciprofloxacin Hcl] Other (See Comments)    Unknown reaction   Gluten Meal     Other reaction(s): Other Esophageal spasms and acid reflux, making it difficult to breathe   Influenza Virus Vaccine     Other reaction(s): Unknown      Objective:    Physical Exam Vitals and nursing note reviewed.   Constitutional:      Appearance: Normal appearance.  HENT:     Right Ear: Tympanic membrane and ear canal normal.     Left Ear: Tympanic membrane and ear canal normal.     Mouth/Throat:     Mouth: Mucous membranes are moist.     Pharynx: No pharyngeal swelling, oropharyngeal exudate, posterior oropharyngeal erythema or uvula swelling.     Tonsils: No tonsillar exudate or tonsillar abscesses.  Cardiovascular:     Rate and Rhythm: Normal rate and regular rhythm.  Pulmonary:     Effort: Pulmonary effort is normal.     Breath sounds: Normal breath sounds.  Musculoskeletal:        General: Normal range of motion.  Lymphadenopathy:     Cervical: No cervical adenopathy.  Skin:    General: Skin is warm and dry.  Neurological:     Mental Status: She is alert.  Psychiatric:        Mood and Affect: Mood normal.        Behavior: Behavior normal.    BP (!) 159/62 (BP Location: Left Arm, Patient Position: Sitting, Cuff Size: Large)   Pulse (!) 59   Temp 97.6 F (36.4 C) (Temporal)   Ht 5\' 5"  (1.651 m)   Wt 134 lb 12.8 oz (61.1 kg)   SpO2 99%   BMI 22.43 kg/m  Wt Readings from Last 3 Encounters:  01/03/22 134 lb 12.8 oz (61.1 kg)  11/29/21 136 lb 2 oz (61.7 kg)  11/07/21 135 lb 4 oz (61.3 kg)      11/09/21, NP

## 2022-01-03 NOTE — Patient Instructions (Addendum)
It was very nice to see you today!   I have sent a refill for you Amlodipine.  And another round of antibiotics for Strep throat.  Call the ENT office and see if there is any way they can get you in before you leave town, if not, then call ENT offices in your new location and if a referral is needed, send me a message and I can send a referral and hope they accept it. Get established with a new primary provider as soon as you can.  Good luck with the move and best wishes!      PLEASE NOTE:  If you had any lab tests please let us know if you have not heard back within a few days. You may see your results on MyChart before we have a chance to review them but we will give you a call once they are reviewed by [place]. If we ordered any referrals today, please let us know if you have not heard from their office within the next week.

## 2022-01-04 NOTE — Assessment & Plan Note (Signed)
   Chronic  taking Amlodipine 7.5mg  qd  BP still a little high today, but pt reports running no higher than 130s/90  refilling med today  planning to move out of town next month, f/u 3 mos if not moved

## 2022-01-29 ENCOUNTER — Ambulatory Visit (HOSPITAL_COMMUNITY)
Admission: RE | Admit: 2022-01-29 | Discharge: 2022-01-29 | Disposition: A | Discharge status: Home / Self Care | Payer: BC Managed Care – PPO | Source: Ambulatory Visit | Attending: Neurosurgery | Admitting: Neurosurgery

## 2022-01-29 DIAGNOSIS — M48062 Spinal stenosis, lumbar region with neurogenic claudication: Secondary | ICD-10-CM | POA: Diagnosis present

## 2022-02-04 ENCOUNTER — Ambulatory Visit (INDEPENDENT_AMBULATORY_CARE_PROVIDER_SITE_OTHER): Payer: BC Managed Care – PPO

## 2022-02-04 DIAGNOSIS — R001 Bradycardia, unspecified: Secondary | ICD-10-CM

## 2022-02-06 LAB — CUP PACEART REMOTE DEVICE CHECK
Battery Remaining Longevity: 176 mo
Battery Voltage: 3.07 V
Brady Statistic AP VP Percent: 0.04 %
Brady Statistic AP VS Percent: 62.54 %
Brady Statistic AS VP Percent: 0 %
Brady Statistic AS VS Percent: 37.42 %
Brady Statistic RA Percent Paced: 62.68 %
Brady Statistic RV Percent Paced: 0.04 %
Date Time Interrogation Session: 20230918163722
Implantable Lead Implant Date: 20221013
Implantable Lead Implant Date: 20221013
Implantable Lead Location: 753859
Implantable Lead Location: 753860
Implantable Lead Model: 3830
Implantable Lead Model: 5076
Implantable Pulse Generator Implant Date: 20221013
Lead Channel Impedance Value: 304 Ohm
Lead Channel Impedance Value: 380 Ohm
Lead Channel Impedance Value: 399 Ohm
Lead Channel Impedance Value: 703 Ohm
Lead Channel Pacing Threshold Amplitude: 0.625 V
Lead Channel Pacing Threshold Amplitude: 1.25 V
Lead Channel Pacing Threshold Pulse Width: 0.4 ms
Lead Channel Pacing Threshold Pulse Width: 0.4 ms
Lead Channel Sensing Intrinsic Amplitude: 0.875 mV
Lead Channel Sensing Intrinsic Amplitude: 0.875 mV
Lead Channel Sensing Intrinsic Amplitude: 3.5 mV
Lead Channel Sensing Intrinsic Amplitude: 3.5 mV
Lead Channel Setting Pacing Amplitude: 1.5 V
Lead Channel Setting Pacing Amplitude: 2.5 V
Lead Channel Setting Pacing Pulse Width: 0.4 ms
Lead Channel Setting Sensing Sensitivity: 0.9 mV

## 2022-02-11 ENCOUNTER — Encounter: Payer: Self-pay | Admitting: *Deleted

## 2022-02-19 NOTE — Progress Notes (Signed)
Remote pacemaker transmission.   

## 2022-05-02 ENCOUNTER — Encounter: Payer: Self-pay | Admitting: *Deleted

## 2022-06-04 ENCOUNTER — Ambulatory Visit: Payer: BC Managed Care – PPO | Attending: Cardiology

## 2022-06-04 DIAGNOSIS — R001 Bradycardia, unspecified: Secondary | ICD-10-CM

## 2022-06-04 LAB — CUP PACEART REMOTE DEVICE CHECK
Battery Remaining Longevity: 172 mo
Battery Voltage: 3.05 V
Brady Statistic AP VP Percent: 0.14 %
Brady Statistic AP VS Percent: 60.75 %
Brady Statistic AS VP Percent: 0 %
Brady Statistic AS VS Percent: 39.1 %
Brady Statistic RA Percent Paced: 61.18 %
Brady Statistic RV Percent Paced: 0.14 %
Date Time Interrogation Session: 20240116143925
Implantable Lead Connection Status: 753985
Implantable Lead Connection Status: 753985
Implantable Lead Implant Date: 20221013
Implantable Lead Implant Date: 20221013
Implantable Lead Location: 753859
Implantable Lead Location: 753860
Implantable Lead Model: 3830
Implantable Lead Model: 5076
Implantable Pulse Generator Implant Date: 20221013
Lead Channel Impedance Value: 342 Ohm
Lead Channel Impedance Value: 399 Ohm
Lead Channel Impedance Value: 399 Ohm
Lead Channel Impedance Value: 703 Ohm
Lead Channel Pacing Threshold Amplitude: 0.625 V
Lead Channel Pacing Threshold Amplitude: 1.25 V
Lead Channel Pacing Threshold Pulse Width: 0.4 ms
Lead Channel Pacing Threshold Pulse Width: 0.4 ms
Lead Channel Sensing Intrinsic Amplitude: 0.75 mV
Lead Channel Sensing Intrinsic Amplitude: 0.75 mV
Lead Channel Sensing Intrinsic Amplitude: 4.125 mV
Lead Channel Sensing Intrinsic Amplitude: 4.125 mV
Lead Channel Setting Pacing Amplitude: 1.5 V
Lead Channel Setting Pacing Amplitude: 2.5 V
Lead Channel Setting Pacing Pulse Width: 0.4 ms
Lead Channel Setting Sensing Sensitivity: 0.9 mV
Zone Setting Status: 755011
Zone Setting Status: 755011

## 2022-06-19 ENCOUNTER — Telehealth: Payer: Self-pay | Admitting: Cardiology

## 2022-06-19 NOTE — Telephone Encounter (Signed)
[facility] is requesting return call to discuss [facility] Monitoring.

## 2022-06-19 NOTE — Telephone Encounter (Signed)
Patient released in [facility]

## 2022-06-28 NOTE — Progress Notes (Signed)
Remote pacemaker transmission.   

## 2022-07-19 ENCOUNTER — Telehealth: Payer: Self-pay

## 2022-07-19 NOTE — Telephone Encounter (Signed)
The patient transferred to [facility] in [place], [place]

## 2024-05-02 IMAGING — CT CT HEAD W/O CM
3 series · 15 of 47 positions shown, 18 images · non-contrast
Comparison: None Available.

CLINICAL DATA: Vertigo, central



[Series 4: head 5.0 h30s · axial · 0.44mm/px · z∈[-77,+48]mm · 9 of 31 slices shown, 12 images]
[im 3/31  brain]
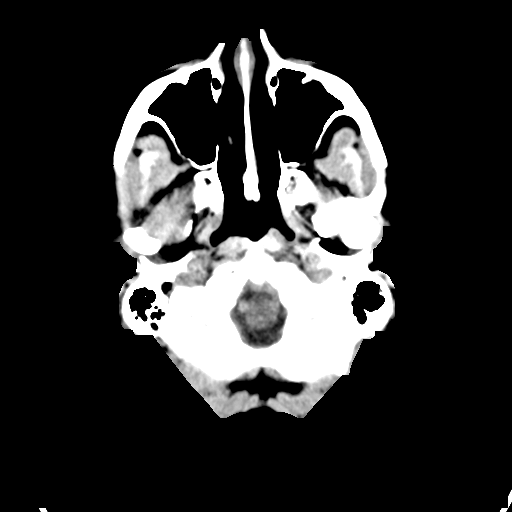
[im 3/31  bone]
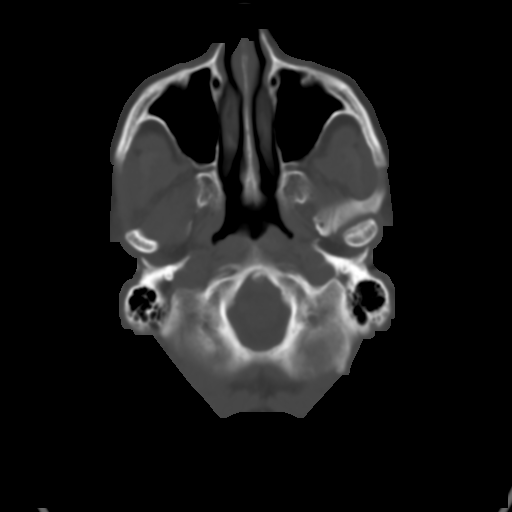
[im 6/31  brain]
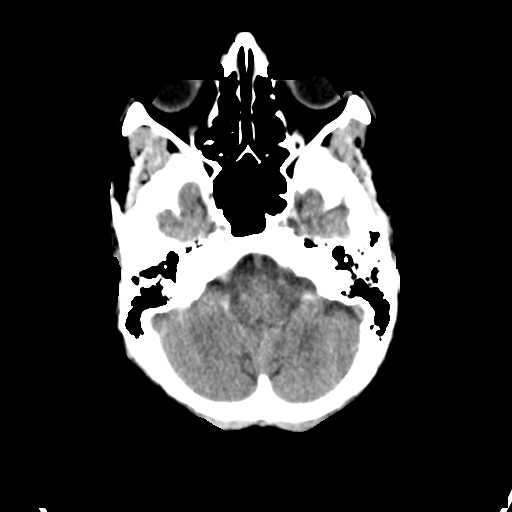
[im 9/31  brain]
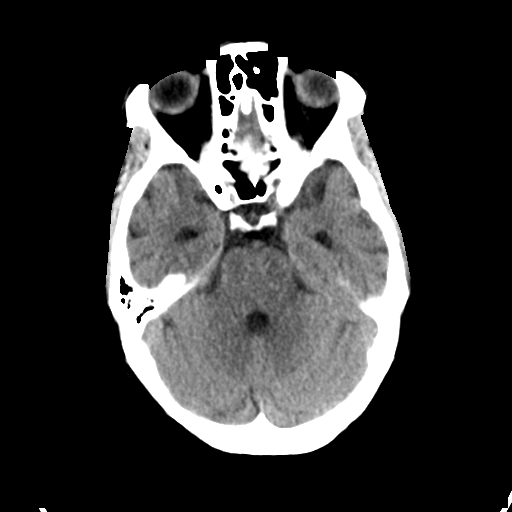
[im 12/31  brain]
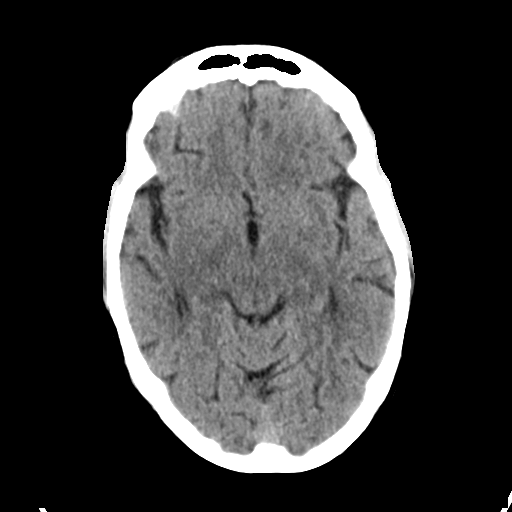
[im 16/31  brain]
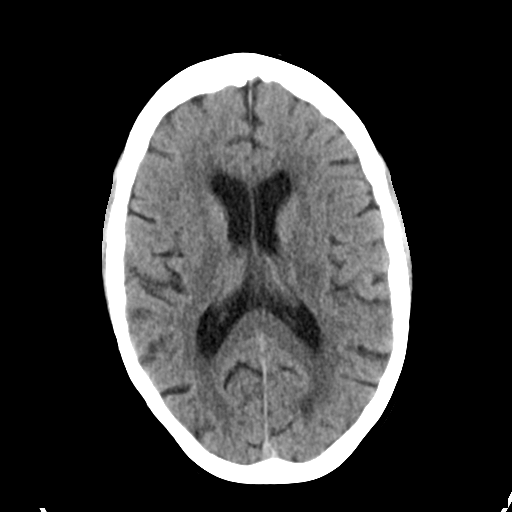
[im 16/31  bone]
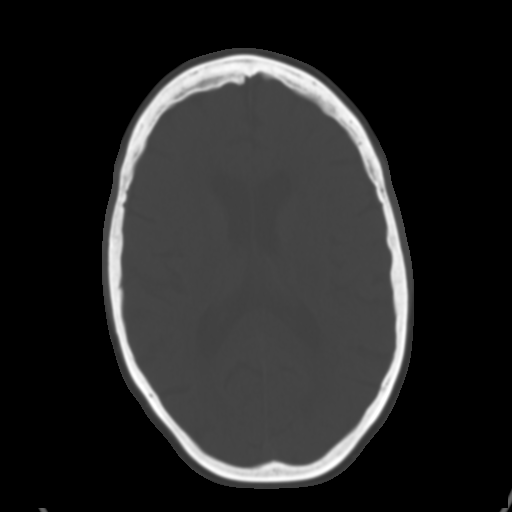
[im 19/31  brain]
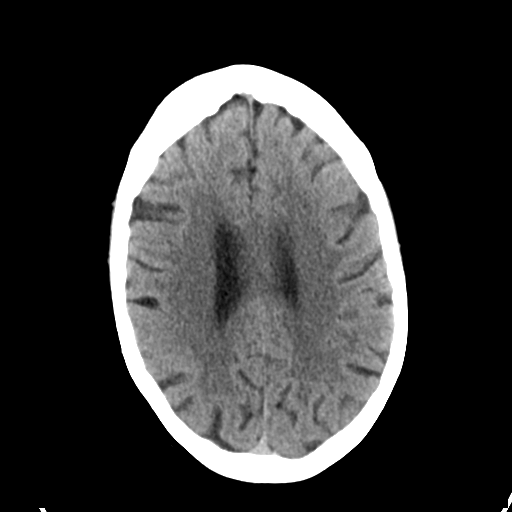
[im 22/31  brain]
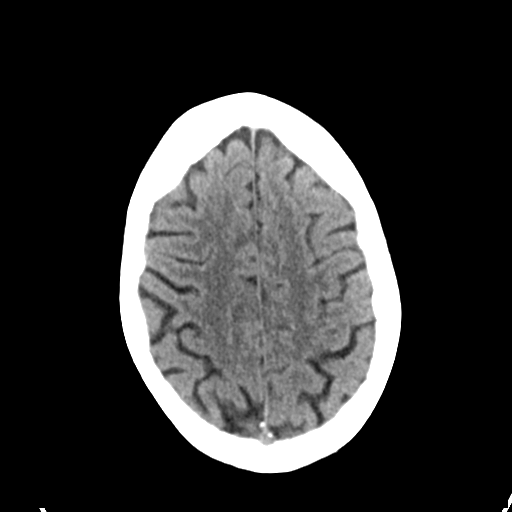
[im 25/31  brain]
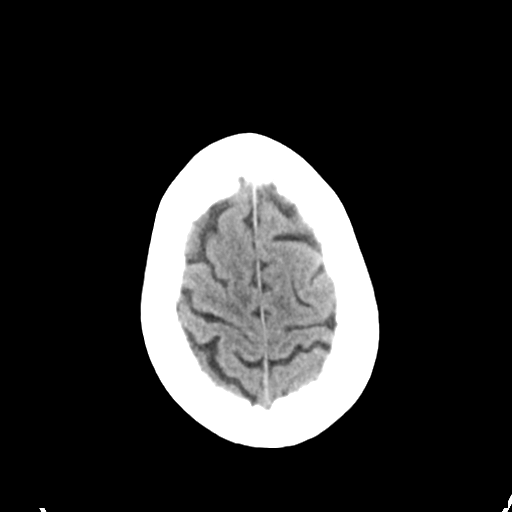
[im 28/31  brain]
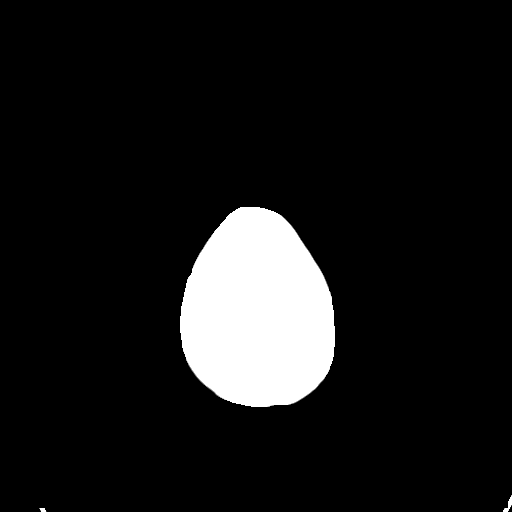
[im 28/31  bone]
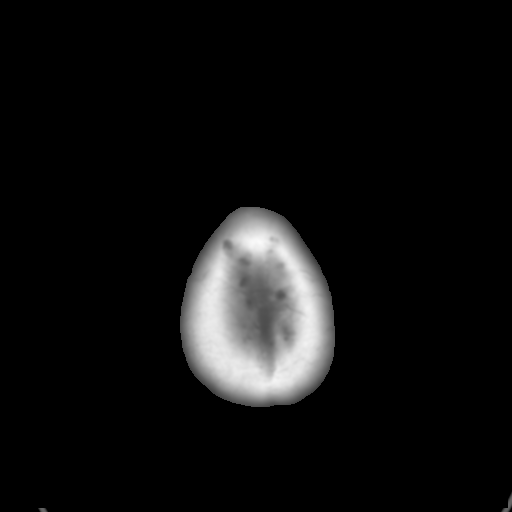

[Series 5: head 3.0 mpr cor · coronal · 0.30mm/px · 3 of 70 slices shown]
[im 24/70  brain]
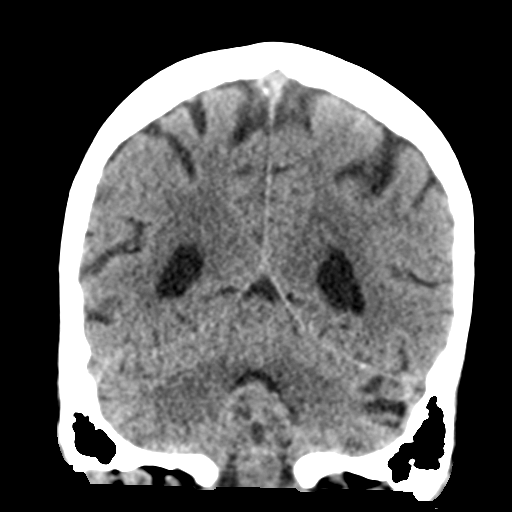
[im 31/70  brain]
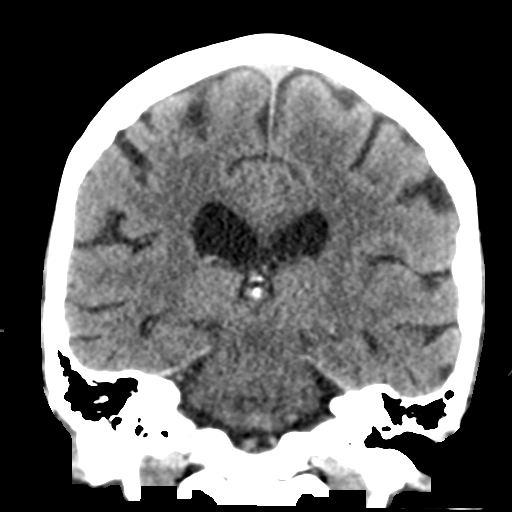
[im 39/70  brain]
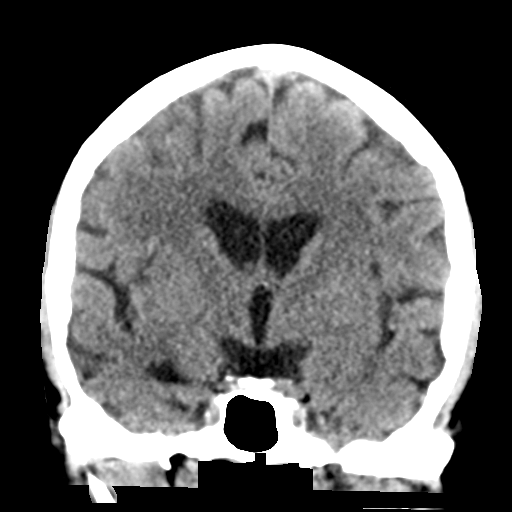

[Series 6: head 3.0 mpr sag · sagittal · 0.31mm/px · 3 of 52 slices shown]
[im 18/52  brain]
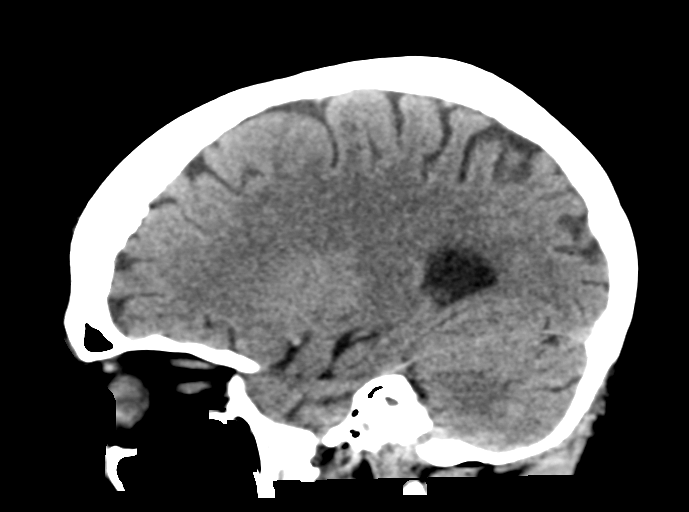
[im 26/52  brain]
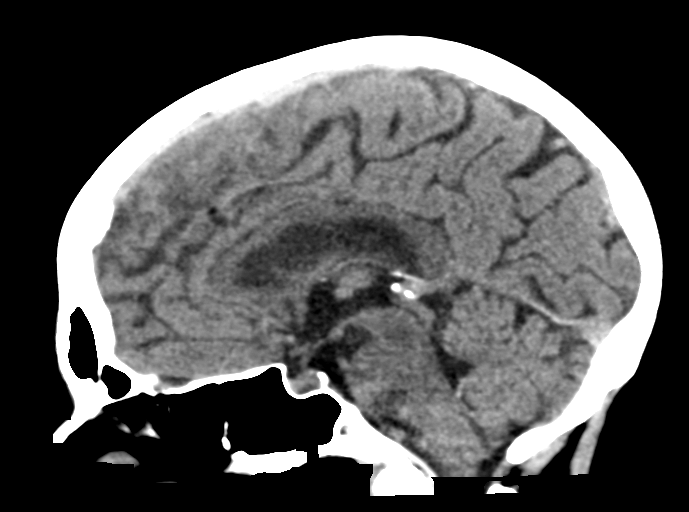
[im 35/52  brain]
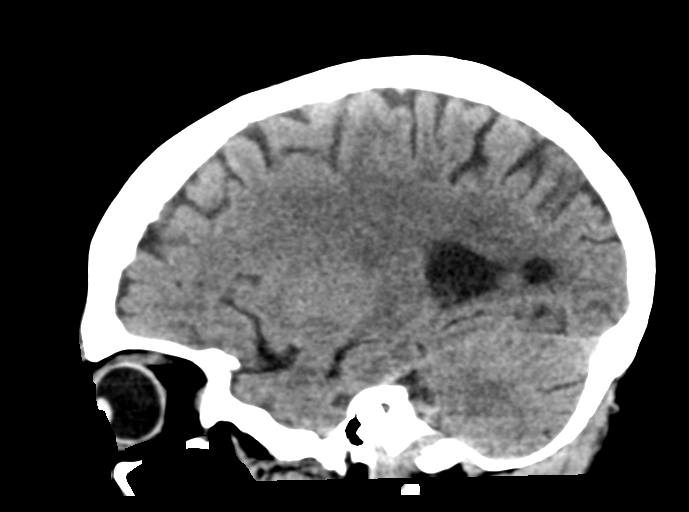

[15 of 47 positions shown; findings below may reference images not displayed]

FINDINGS: Brain:

No evidence of large-territorial acute infarction. No parenchymal
hemorrhage. No mass lesion. No extra-axial collection.

No mass effect or midline shift. No hydrocephalus. Basilar cisterns
are patent.

Vascular: No hyperdense vessel.

Skull: No acute fracture or focal lesion.

Sinuses/Orbits: Paranasal sinuses and mastoid air cells are clear.
The orbits are unremarkable.

Other: None.
IMPRESSION: No acute intracranial abnormality.
